# Patient Record
Sex: Female | Born: 1950 | Race: Black or African American | Hispanic: No | Marital: Single | Smoking: Former smoker
Health system: Southern US, Community
[De-identification: ages and names within clinical notes are randomized; demographics above are authoritative.]

## PROBLEM LIST (undated history)

## (undated) DIAGNOSIS — E119 Type 2 diabetes mellitus without complications: Secondary | ICD-10-CM

## (undated) DIAGNOSIS — I10 Essential (primary) hypertension: Secondary | ICD-10-CM

## (undated) DIAGNOSIS — F32A Depression, unspecified: Secondary | ICD-10-CM

## (undated) DIAGNOSIS — E785 Hyperlipidemia, unspecified: Secondary | ICD-10-CM

## (undated) DIAGNOSIS — M1990 Unspecified osteoarthritis, unspecified site: Secondary | ICD-10-CM

## (undated) HISTORY — PX: HAND SURGERY: SHX662

## (undated) HISTORY — DX: Hyperlipidemia, unspecified: E78.5

## (undated) HISTORY — DX: Essential (primary) hypertension: I10

## (undated) HISTORY — DX: Depression, unspecified: F32.A

## (undated) HISTORY — DX: Type 2 diabetes mellitus without complications: E11.9

## (undated) HISTORY — PX: TOTAL KNEE ARTHROPLASTY: SHX125

---
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
## 1997-08-22 ENCOUNTER — Emergency Department (HOSPITAL_COMMUNITY): Admission: EM | Admit: 1997-08-22 | Discharge: 1997-08-22 | Payer: Self-pay | Admitting: Emergency Medicine

## 1997-10-03 ENCOUNTER — Emergency Department (HOSPITAL_COMMUNITY): Admission: EM | Admit: 1997-10-03 | Discharge: 1997-10-03 | Payer: Self-pay

## 1997-12-22 ENCOUNTER — Emergency Department (HOSPITAL_COMMUNITY): Admission: EM | Admit: 1997-12-22 | Discharge: 1997-12-22 | Payer: Self-pay

## 1998-05-01 ENCOUNTER — Emergency Department (HOSPITAL_COMMUNITY): Admission: EM | Admit: 1998-05-01 | Discharge: 1998-05-01 | Payer: Self-pay | Admitting: Emergency Medicine

## 1998-05-01 ENCOUNTER — Encounter: Payer: Self-pay | Admitting: Emergency Medicine

## 1998-08-20 ENCOUNTER — Emergency Department (HOSPITAL_COMMUNITY): Admission: EM | Admit: 1998-08-20 | Discharge: 1998-08-20 | Payer: Self-pay | Admitting: Internal Medicine

## 1998-08-20 ENCOUNTER — Encounter: Payer: Self-pay | Admitting: Internal Medicine

## 1998-12-12 ENCOUNTER — Emergency Department (HOSPITAL_COMMUNITY): Admission: EM | Admit: 1998-12-12 | Discharge: 1998-12-12 | Payer: Self-pay | Admitting: Emergency Medicine

## 1999-07-24 ENCOUNTER — Encounter: Payer: Self-pay | Admitting: Emergency Medicine

## 1999-07-24 ENCOUNTER — Emergency Department (HOSPITAL_COMMUNITY): Admission: EM | Admit: 1999-07-24 | Discharge: 1999-07-24 | Payer: Self-pay | Admitting: Emergency Medicine

## 1999-07-28 ENCOUNTER — Emergency Department (HOSPITAL_COMMUNITY): Admission: EM | Admit: 1999-07-28 | Discharge: 1999-07-28 | Payer: Self-pay | Admitting: Emergency Medicine

## 1999-08-13 ENCOUNTER — Encounter: Admission: RE | Admit: 1999-08-13 | Discharge: 1999-11-11 | Payer: Self-pay | Admitting: Orthopedic Surgery

## 2000-04-25 ENCOUNTER — Emergency Department (HOSPITAL_COMMUNITY): Admission: EM | Admit: 2000-04-25 | Discharge: 2000-04-25 | Payer: Self-pay

## 2000-10-23 ENCOUNTER — Emergency Department (HOSPITAL_COMMUNITY): Admission: EM | Admit: 2000-10-23 | Discharge: 2000-10-23 | Payer: Self-pay | Admitting: Emergency Medicine

## 2000-10-24 ENCOUNTER — Encounter: Payer: Self-pay | Admitting: Emergency Medicine

## 2001-02-15 ENCOUNTER — Emergency Department (HOSPITAL_COMMUNITY): Admission: EM | Admit: 2001-02-15 | Discharge: 2001-02-15 | Payer: Self-pay | Admitting: Emergency Medicine

## 2001-03-08 ENCOUNTER — Emergency Department (HOSPITAL_COMMUNITY): Admission: EM | Admit: 2001-03-08 | Discharge: 2001-03-08 | Payer: Self-pay | Admitting: Emergency Medicine

## 2001-10-20 ENCOUNTER — Emergency Department (HOSPITAL_COMMUNITY): Admission: EM | Admit: 2001-10-20 | Discharge: 2001-10-20 | Payer: Self-pay | Admitting: Emergency Medicine

## 2001-11-25 ENCOUNTER — Encounter: Payer: Self-pay | Admitting: Emergency Medicine

## 2001-11-25 ENCOUNTER — Emergency Department (HOSPITAL_COMMUNITY): Admission: EM | Admit: 2001-11-25 | Discharge: 2001-11-25 | Payer: Self-pay | Admitting: Emergency Medicine

## 2001-12-14 ENCOUNTER — Emergency Department (HOSPITAL_COMMUNITY): Admission: EM | Admit: 2001-12-14 | Discharge: 2001-12-14 | Payer: Self-pay | Admitting: *Deleted

## 2002-02-02 ENCOUNTER — Emergency Department (HOSPITAL_COMMUNITY): Admission: EM | Admit: 2002-02-02 | Discharge: 2002-02-03 | Payer: Self-pay

## 2002-02-12 ENCOUNTER — Emergency Department (HOSPITAL_COMMUNITY): Admission: EM | Admit: 2002-02-12 | Discharge: 2002-02-12 | Payer: Self-pay | Admitting: Emergency Medicine

## 2002-08-10 ENCOUNTER — Emergency Department (HOSPITAL_COMMUNITY): Admission: EM | Admit: 2002-08-10 | Discharge: 2002-08-10 | Payer: Self-pay | Admitting: Emergency Medicine

## 2002-08-10 ENCOUNTER — Encounter: Payer: Self-pay | Admitting: Emergency Medicine

## 2003-01-04 ENCOUNTER — Emergency Department (HOSPITAL_COMMUNITY): Admission: EM | Admit: 2003-01-04 | Discharge: 2003-01-04 | Payer: Self-pay | Admitting: Emergency Medicine

## 2003-03-17 ENCOUNTER — Emergency Department (HOSPITAL_COMMUNITY): Admission: EM | Admit: 2003-03-17 | Discharge: 2003-03-18 | Payer: Self-pay | Admitting: Emergency Medicine

## 2003-05-31 ENCOUNTER — Emergency Department (HOSPITAL_COMMUNITY): Admission: EM | Admit: 2003-05-31 | Discharge: 2003-05-31 | Payer: Self-pay | Admitting: Emergency Medicine

## 2003-10-06 ENCOUNTER — Emergency Department (HOSPITAL_COMMUNITY): Admission: EM | Admit: 2003-10-06 | Discharge: 2003-10-06 | Payer: Self-pay | Admitting: Emergency Medicine

## 2004-03-29 ENCOUNTER — Emergency Department (HOSPITAL_COMMUNITY): Admission: EM | Admit: 2004-03-29 | Discharge: 2004-03-29 | Payer: Self-pay | Admitting: Emergency Medicine

## 2004-08-14 ENCOUNTER — Emergency Department (HOSPITAL_COMMUNITY): Admission: EM | Admit: 2004-08-14 | Discharge: 2004-08-15 | Payer: Self-pay | Admitting: Emergency Medicine

## 2005-05-30 ENCOUNTER — Emergency Department (HOSPITAL_COMMUNITY): Admission: EM | Admit: 2005-05-30 | Discharge: 2005-05-30 | Payer: Self-pay | Admitting: Emergency Medicine

## 2005-07-09 ENCOUNTER — Emergency Department (HOSPITAL_COMMUNITY): Admission: EM | Admit: 2005-07-09 | Discharge: 2005-07-09 | Payer: Self-pay | Admitting: Emergency Medicine

## 2005-11-12 ENCOUNTER — Emergency Department (HOSPITAL_COMMUNITY): Admission: EM | Admit: 2005-11-12 | Discharge: 2005-11-12 | Payer: Self-pay | Admitting: Emergency Medicine

## 2006-06-09 ENCOUNTER — Emergency Department (HOSPITAL_COMMUNITY): Admission: EM | Admit: 2006-06-09 | Discharge: 2006-06-09 | Payer: Self-pay | Admitting: Emergency Medicine

## 2006-07-02 ENCOUNTER — Emergency Department (HOSPITAL_COMMUNITY): Admission: EM | Admit: 2006-07-02 | Discharge: 2006-07-02 | Payer: Self-pay | Admitting: *Deleted

## 2007-11-22 ENCOUNTER — Emergency Department (HOSPITAL_COMMUNITY): Admission: EM | Admit: 2007-11-22 | Discharge: 2007-11-23 | Payer: Self-pay | Admitting: Emergency Medicine

## 2008-03-18 ENCOUNTER — Emergency Department (HOSPITAL_COMMUNITY): Admission: EM | Admit: 2008-03-18 | Discharge: 2008-03-18 | Payer: Self-pay | Admitting: Emergency Medicine

## 2008-03-28 ENCOUNTER — Emergency Department (HOSPITAL_COMMUNITY): Admission: EM | Admit: 2008-03-28 | Discharge: 2008-03-28 | Payer: Self-pay | Admitting: Emergency Medicine

## 2008-05-27 ENCOUNTER — Emergency Department (HOSPITAL_COMMUNITY): Admission: EM | Admit: 2008-05-27 | Discharge: 2008-05-28 | Payer: Self-pay | Admitting: Emergency Medicine

## 2008-08-30 ENCOUNTER — Encounter: Admission: RE | Admit: 2008-08-30 | Discharge: 2008-08-30 | Payer: Self-pay | Admitting: Specialist

## 2008-09-18 ENCOUNTER — Emergency Department (HOSPITAL_COMMUNITY): Admission: EM | Admit: 2008-09-18 | Discharge: 2008-09-18 | Payer: Self-pay | Admitting: Emergency Medicine

## 2009-01-08 ENCOUNTER — Emergency Department (HOSPITAL_COMMUNITY): Admission: EM | Admit: 2009-01-08 | Discharge: 2009-01-08 | Payer: Self-pay | Admitting: Emergency Medicine

## 2010-01-02 ENCOUNTER — Emergency Department (HOSPITAL_COMMUNITY): Admission: EM | Admit: 2010-01-02 | Discharge: 2010-01-02 | Payer: Self-pay | Admitting: Emergency Medicine

## 2010-02-01 ENCOUNTER — Emergency Department (HOSPITAL_COMMUNITY): Admission: EM | Admit: 2010-02-01 | Discharge: 2010-02-01 | Payer: Self-pay | Admitting: Emergency Medicine

## 2010-03-20 ENCOUNTER — Emergency Department (HOSPITAL_COMMUNITY)
Admission: EM | Admit: 2010-03-20 | Discharge: 2010-03-20 | Payer: Self-pay | Source: Home / Self Care | Admitting: Emergency Medicine

## 2010-04-15 HISTORY — PX: TOTAL KNEE ARTHROPLASTY: SHX125

## 2010-07-06 ENCOUNTER — Other Ambulatory Visit (HOSPITAL_COMMUNITY): Payer: Self-pay | Admitting: Orthopedic Surgery

## 2010-07-06 ENCOUNTER — Encounter (HOSPITAL_COMMUNITY)
Admission: RE | Admit: 2010-07-06 | Discharge: 2010-07-06 | Disposition: A | Discharge status: Home / Self Care | Payer: Medicare Other | Source: Ambulatory Visit | Attending: Orthopedic Surgery | Admitting: Orthopedic Surgery

## 2010-07-06 ENCOUNTER — Ambulatory Visit (HOSPITAL_COMMUNITY)
Admission: RE | Admit: 2010-07-06 | Discharge: 2010-07-06 | Disposition: A | Discharge status: Home / Self Care | Payer: Medicare Other | Source: Ambulatory Visit | Attending: Orthopedic Surgery | Admitting: Orthopedic Surgery

## 2010-07-06 DIAGNOSIS — Z01810 Encounter for preprocedural cardiovascular examination: Secondary | ICD-10-CM | POA: Insufficient documentation

## 2010-07-06 DIAGNOSIS — Z01818 Encounter for other preprocedural examination: Secondary | ICD-10-CM | POA: Insufficient documentation

## 2010-07-06 DIAGNOSIS — M1710 Unilateral primary osteoarthritis, unspecified knee: Secondary | ICD-10-CM

## 2010-07-06 DIAGNOSIS — Z01812 Encounter for preprocedural laboratory examination: Secondary | ICD-10-CM | POA: Insufficient documentation

## 2010-07-06 DIAGNOSIS — IMO0002 Reserved for concepts with insufficient information to code with codable children: Secondary | ICD-10-CM | POA: Insufficient documentation

## 2010-07-06 LAB — URINALYSIS, ROUTINE W REFLEX MICROSCOPIC
Hgb urine dipstick: NEGATIVE
Specific Gravity, Urine: 1.008 (ref 1.005–1.030)
Urobilinogen, UA: 0.2 mg/dL (ref 0.0–1.0)

## 2010-07-06 LAB — CBC
MCHC: 33.6 g/dL (ref 30.0–36.0)
Platelets: 250 10*3/uL (ref 150–400)
RDW: 13.9 % (ref 11.5–15.5)

## 2010-07-06 LAB — BASIC METABOLIC PANEL
BUN: 9 mg/dL (ref 6–23)
CO2: 27 mEq/L (ref 19–32)
Calcium: 9.4 mg/dL (ref 8.4–10.5)
Chloride: 107 mEq/L (ref 96–112)
Creatinine, Ser: 0.68 mg/dL (ref 0.4–1.2)
GFR calc Af Amer: >60 mL/min (ref >60)
GFR calc non Af Amer: >60 mL/min (ref >60)
Glucose, Bld: 93 mg/dL (ref 70–99)
Potassium: 4.6 mEq/L (ref 3.5–5.1)
Sodium: 138 mEq/L (ref 135–145)

## 2010-07-06 LAB — URINE MICROSCOPIC-ADD ON

## 2010-07-06 LAB — DIFFERENTIAL
Basophils Absolute: 0 10*3/uL (ref 0.0–0.1)
Basophils Relative: 0 % (ref 0–1)
Eosinophils Absolute: 0.3 10*3/uL (ref 0.0–0.7)
Eosinophils Relative: 3 % (ref 0–5)
Monocytes Absolute: 0.5 10*3/uL (ref 0.1–1.0)
Neutro Abs: 4.6 10*3/uL (ref 1.7–7.7)

## 2010-07-06 LAB — SURGICAL PCR SCREEN
MRSA, PCR: NEGATIVE
Staphylococcus aureus: NEGATIVE

## 2010-07-06 LAB — PROTIME-INR
INR: 0.95 (ref 0.00–1.49)
Prothrombin Time: 12.9 seconds (ref 11.6–15.2)

## 2010-07-06 LAB — TYPE AND SCREEN

## 2010-07-06 LAB — ABO/RH: ABO/RH(D): B POS

## 2010-07-09 ENCOUNTER — Inpatient Hospital Stay (HOSPITAL_COMMUNITY)
Admission: RE | Admit: 2010-07-09 | Discharge: 2010-07-12 | DRG: 470 | Disposition: A | Discharge status: Skilled Nursing Facility | Payer: Medicare Other | Source: Ambulatory Visit | Attending: Orthopedic Surgery | Admitting: Orthopedic Surgery

## 2010-07-09 DIAGNOSIS — F329 Major depressive disorder, single episode, unspecified: Secondary | ICD-10-CM | POA: Diagnosis present

## 2010-07-09 DIAGNOSIS — F3289 Other specified depressive episodes: Secondary | ICD-10-CM | POA: Diagnosis present

## 2010-07-09 DIAGNOSIS — M1710 Unilateral primary osteoarthritis, unspecified knee: Principal | ICD-10-CM | POA: Diagnosis present

## 2010-07-09 DIAGNOSIS — Z87891 Personal history of nicotine dependence: Secondary | ICD-10-CM

## 2010-07-09 DIAGNOSIS — Z01810 Encounter for preprocedural cardiovascular examination: Secondary | ICD-10-CM

## 2010-07-09 DIAGNOSIS — Z01812 Encounter for preprocedural laboratory examination: Secondary | ICD-10-CM

## 2010-07-09 DIAGNOSIS — Z7901 Long term (current) use of anticoagulants: Secondary | ICD-10-CM

## 2010-07-10 LAB — BASIC METABOLIC PANEL
CO2: 27 mEq/L (ref 19–32)
Calcium: 8.6 mg/dL (ref 8.4–10.5)
Creatinine, Ser: 0.74 mg/dL (ref 0.4–1.2)
GFR calc Af Amer: >60 mL/min (ref >60)
GFR calc non Af Amer: >60 mL/min (ref >60)
Sodium: 138 mEq/L (ref 135–145)

## 2010-07-10 LAB — CBC
Platelets: 219 10*3/uL (ref 150–400)
RBC: 4.11 MIL/uL (ref 3.87–5.11)
RDW: 14.3 % (ref 11.5–15.5)
WBC: 9.9 10*3/uL (ref 4.0–10.5)

## 2010-07-10 LAB — PROTIME-INR
INR: 1.08 (ref 0.00–1.49)
Prothrombin Time: 14.2 seconds (ref 11.6–15.2)

## 2010-07-11 LAB — CBC
Hemoglobin: 11.5 g/dL — ABNORMAL LOW (ref 12.0–15.0)
MCH: 29.6 pg (ref 26.0–34.0)
Platelets: 189 10*3/uL (ref 150–400)
RBC: 3.88 MIL/uL (ref 3.87–5.11)

## 2010-07-11 LAB — PROTIME-INR
INR: 1.47 (ref 0.00–1.49)
Prothrombin Time: 18 seconds — ABNORMAL HIGH (ref 11.6–15.2)

## 2010-07-12 LAB — CBC
Hemoglobin: 10.8 g/dL — ABNORMAL LOW (ref 12.0–15.0)
MCH: 29.4 pg (ref 26.0–34.0)
MCHC: 33.1 g/dL (ref 30.0–36.0)
Platelets: 178 10*3/uL (ref 150–400)
RBC: 3.67 MIL/uL — ABNORMAL LOW (ref 3.87–5.11)

## 2010-07-12 LAB — PROTIME-INR
INR: 1.61 — ABNORMAL HIGH (ref 0.00–1.49)
Prothrombin Time: 19.3 seconds — ABNORMAL HIGH (ref 11.6–15.2)

## 2010-07-12 NOTE — Op Note (Signed)
[name], [name]              ACCOUNT NO.:  [id number]  MEDICAL RECORD NO.:  [id number]           PATIENT TYPE:  I  LOCATION:  5029                         FACILITY:  [facility]  PHYSICIAN:  Feliberto Gottron. Turner Daniels, M.D.   DATE OF BIRTH:  Aug 23, 1950  DATE OF PROCEDURE:  07/09/2010 DATE OF DISCHARGE:                              OPERATIVE REPORT   PREOPERATIVE DIAGNOSIS:  End-stage arthritis of the left knee.  POSTOPERATIVE DIAGNOSIS:  End-stage arthritis of the left knee.  PROCEDURE:  Left total knee arthroplasty using DePuy Sigma RP components, 2 femur, 2 tibia, 10-mm Sigma RP bearing and a 32-mm patellar button double batch of DePuy HV cement with 1500 mg of Zinacef.  SURGEON:  Feliberto Gottron. Turner Daniels, MD  FIRST ASSISTANT:  Shirl Harris, PA-C  ANESTHETIC:  Endotracheal.  ESTIMATED BLOOD LOSS:  Minimal.  FLUID REPLACEMENT:  59 mL of crystalloid.  DRAINS PLACED:  Foley catheter.  URINE OUTPUT:  300 mL.  TOURNIQUET TIME:  1 hour and 25 minutes.  INDICATIONS FOR PROCEDURE:  The patient is a 60 year old woman end-stage arthritis of both knees, left worse than right symptomatically.  She is down to bone-on-bone to the medial compartments with varus deformities and desires elective left than right total knee arthroplasty.  Risks and benefits of surgery have been discussed, questions answered.  DESCRIPTION OF PROCEDURE:  The patient identified by armband, received preoperative IV antibiotics in the holding area at [facility] followed by left femoral nerve block.  She was then taken to operating room 5, appropriate site monitors were attached and general endotracheal anesthesia induced with the patient in supine position.  Foley catheter inserted.  Tourniquet applied high to the left thigh and left lower extremity prepped and draped in usual sterile fashion from the ankle to the tourniquet.  A foot positioner lateral post was also applied to the table.  Time-out procedure was  performed.  Limb wrapped with an Esmarch bandage, tourniquet inflated to 350 mmHg.  We began the operation itself by making an anterior midline incision centered over the patella through the skin and subcutaneous tissue.  We started handbreadth above the patella and entered 1 cm medial to and 3 cm distal to the tibial tubercle.  We identified transverse retinaculum which was incised in line with the skin incision, reflected medially allowing a medial parapatellar arthrotomy.  The patella was everted, prepatellar fat pad resected.  Superficial medial collateral ligament elevated from anterior to posterior off the proximal tibia leaving intact distally allowing external rotation of the tibia.  With the patella everted, we then resected the anterior one-half of the menisci and the cruciate ligaments, placed a posteromedial Z retractor and Michaela retractor through the notch and a lateral Homan retractor completing our proximal tibia exposure with the knee hyperflexed.  We then entered the tibial plateau in line with the axis of the tibia with the DePuy step drill followed by intramedullary guide rod and a 2-degree posterior slope cutting guide that was set to allow removal of about 3-4 mm of bone medially, 8-9 mm of bone and cartilage laterally.  Proximal tibial cut was then accomplished  without difficulty.  We entered the distal femur 2 mm anterior to the PCL origin with a step drill followed by the IM rod and a 5-degree left distal femoral cutting guide set at 10 mm.  This was pinned along the epicondylar axis.  The distal femoral cut accomplished. We sized for a #2 femoral implant using the posterior referencing sizing guide.  The chamfer cutting guide was then pinned in 3 degrees of external rotation, anterior and posterior and chamfer cuts accomplished without difficulty followed by the Sigma RP box cutting guide and cut. The knee was then brought to full extension with traction,  allowing resection of the posterior half of the menisci.  We checked our extension gap which was good for a 10-mm bearing and the flexion gap was also in line for a 10-mm bearing.  The patella was measured at 20 mm. We thought it would fit 32 button.  The cutting guide was set at 13 and the posterior 7-8 mm of the patella resected, sized for a 32 button and drilled.  The knee was then hyperflexed exposing the proximal tibia which was sized to #2 tibial trial plate, followed by the smokestack conical reamer and Delta fin keel punch.  We then hammered into place a 2 left trial femoral component, drilled the lugs, inserted a 10-mm bearing, reduced the knee and took it through range of motion from 0 to 130 degrees with the trial patellar button and tracked with no thumb pressure.  At this point the trial components removed, all bony surfaces were then water picked, clean dried with suction and sponges at the back table, a double batch of DePuy HV cement with 1500 mg Zinacef was mixed and applied to all bony and metallic mating surfaces except for the posterior condyles of the femur itself.  In order we hammered into place a two tibial baseplate and removed excess cement.  A two left femoral component removed excess cement, inserted a 10-mm sigma RP bearing, reduced the knee and brought it to full extension with compression again, squeezing out a little bit of cement that was removed with the [employer].  The 32-mm patellar button was squeezed onto the patella with the clamp and excess cement removed.  The wound irrigated out with normal saline solution and pulse lavage.  Medium Hemovac drains were placed from anterolateral approach as the cement cured.  We then took the knee through range of motion with the clamp off after the cement cured and again no thumb pressure was required and the ligaments were noted to be stable.  At this point we closed parapatellar arthrotomy with running #1  Vicryl suture, the subcutaneous tissue with 0 and 2-0 undyed Vicryl suture and the skin with skin staples.  Dressing of Xeroform, 4x4 dressing, sponges, Webril and Ace wrap applied. Tourniquet let down.  The toes were noted to pink up.  The patient awakened, extubated and taken to the recovery room without difficulty.     Feliberto Gottron. Turner Daniels, M.D.     Ovid Curd  D:  07/10/2010  T:  07/11/2010  Job:  [id number]  Electronically Signed by Gean Birchwood M.D. on 07/12/2010 11:17:45 PM

## 2010-07-16 ENCOUNTER — Emergency Department (HOSPITAL_COMMUNITY)
Admission: EM | Admit: 2010-07-16 | Discharge: 2010-07-16 | Disposition: A | Discharge status: Home / Self Care | Payer: Medicare Other | Attending: Emergency Medicine | Admitting: Emergency Medicine

## 2010-07-16 DIAGNOSIS — Z9889 Other specified postprocedural states: Secondary | ICD-10-CM | POA: Insufficient documentation

## 2010-07-16 DIAGNOSIS — T391X5A Adverse effect of 4-Aminophenol derivatives, initial encounter: Secondary | ICD-10-CM | POA: Insufficient documentation

## 2010-07-16 DIAGNOSIS — F329 Major depressive disorder, single episode, unspecified: Secondary | ICD-10-CM | POA: Insufficient documentation

## 2010-07-16 DIAGNOSIS — L270 Generalized skin eruption due to drugs and medicaments taken internally: Secondary | ICD-10-CM | POA: Insufficient documentation

## 2010-07-16 DIAGNOSIS — F3289 Other specified depressive episodes: Secondary | ICD-10-CM | POA: Insufficient documentation

## 2010-07-16 DIAGNOSIS — Z79899 Other long term (current) drug therapy: Secondary | ICD-10-CM | POA: Insufficient documentation

## 2010-07-16 DIAGNOSIS — L299 Pruritus, unspecified: Secondary | ICD-10-CM | POA: Insufficient documentation

## 2010-07-16 DIAGNOSIS — Y9210 Unspecified residential institution as the place of occurrence of the external cause: Secondary | ICD-10-CM | POA: Insufficient documentation

## 2010-07-16 DIAGNOSIS — Z7901 Long term (current) use of anticoagulants: Secondary | ICD-10-CM | POA: Insufficient documentation

## 2010-07-16 DIAGNOSIS — M129 Arthropathy, unspecified: Secondary | ICD-10-CM | POA: Insufficient documentation

## 2010-07-17 ENCOUNTER — Emergency Department (HOSPITAL_COMMUNITY)
Admission: EM | Admit: 2010-07-17 | Discharge: 2010-07-18 | Disposition: A | Discharge status: Home / Self Care | Payer: Medicare Other | Attending: Emergency Medicine | Admitting: Emergency Medicine

## 2010-07-17 DIAGNOSIS — L298 Other pruritus: Secondary | ICD-10-CM | POA: Insufficient documentation

## 2010-07-17 DIAGNOSIS — L500 Allergic urticaria: Secondary | ICD-10-CM | POA: Insufficient documentation

## 2010-07-17 DIAGNOSIS — T40605A Adverse effect of unspecified narcotics, initial encounter: Secondary | ICD-10-CM | POA: Insufficient documentation

## 2010-07-17 DIAGNOSIS — L2989 Other pruritus: Secondary | ICD-10-CM | POA: Insufficient documentation

## 2010-07-20 NOTE — Discharge Summary (Signed)
Erin, Webb              ACCOUNT NO.:  [id number]  MEDICAL RECORD NO.:  [id number]           PATIENT TYPE:  I  LOCATION:  5029                         FACILITY:  [facility]  PHYSICIAN:  Feliberto Gottron. Turner Daniels, M.D.   DATE OF BIRTH:  [birth year]  DATE OF ADMISSION:  07/09/2010 DATE OF DISCHARGE:  07/12/2010                              DISCHARGE SUMMARY   CHIEF COMPLAINT:  Left hip pain.  HISTORY OF PRESENT ILLNESS:  This is a 60 year old lady who complains of severe unremitting pain in her left knee despite extensive conservative treatment.  She now desires a surgical intervention.  All risks and benefits of surgery were discussed with the patient.  PAST MEDICAL HISTORY:  Significant for depression.  PAST SURGICAL HISTORY:  She has not listed any surgeries.  FAMILY HISTORY:  Noncontributory.  SOCIAL HISTORY:  She quit smoking several years ago and denies use of alcohol.  ALLERGIES:  No known drug allergies.  PHYSICAL EXAMINATION:  Gross examination the left knee demonstrates a varus deformity.  Range of motion is 5 to 100 degrees.  She is tender to palpation along the medial joint line.  X-rays demonstrate end-stage arthritis in the medial compartment of the left knee.  PREOPERATIVE LABORATORY DATA:  White blood cells 9.0, red blood cells 4.88, hemoglobin 43.1, hematocrit 88.3, platelets 250.  PT 12.9, INR 0.95, PTT 28.  Sodium 138, potassium 4.6, chloride 107, glucose 93, BUN 9, creatinine 0.68.  Urinalysis was within normal limits.  HOSPITAL COURSE:  [name] was admitted to [facility] on July 09, 2010 when she underwent left total knee arthroplasty.  The procedure was performed by Dr. Gean Birchwood, and the patient tolerated it well.  Two Hemovac drains were placed in the left knee perioperative.  Foley catheter was also placed.  She was transferred to the floor in stable condition on Lovenox and Coumadin for DVT prophylaxis.  On the first postoperative day, she was  awake and alert and reporting good pain control.  Her drain was removed without difficulty.  The Foley catheter was taken out after physical therapy.  Hemoglobin was 12.  On the second postoperative day, she complained of itching, but was otherwise doing well.  She was making slow progress with physical therapy.  On postoperative day #3, she was eating well and continued to report good pain control.  Her surgical dressing was changed and her incision was found to be benign.  Hemoglobin was 11.5.  She was discharged to skilled nursing for a short stay for rehab.  DISPOSITION:  The patient was discharged on July 12, 2010.  She was weightbearing as tolerated.  Skilled nursing would manage her wound, Coumadin, and physical therapy.  Her target INR is 1.5 to 2.0.  She will continue all meds as per the HMR with the addition of Percocet for pain control and Coumadin.  She will return to the clinic in approximately 10 days for x-rays and staple removal.  FINAL DIAGNOSIS:  End-stage degenerative joint disease of the left knee.  Shirl Harris, PA   ______________________________ Feliberto Gottron. Turner Daniels, M.D.    JW/MEDQ  D:  07/12/2010  T:  07/12/2010  Job:  [id number]  Electronically Signed by Shirl Harris PA on 07/16/2010 09:50:30 AM Electronically Signed by Gean Birchwood M.D. on 07/20/2010 05:37:47 AM

## 2010-07-21 ENCOUNTER — Emergency Department (HOSPITAL_COMMUNITY)
Admission: EM | Admit: 2010-07-21 | Discharge: 2010-07-21 | Disposition: A | Discharge status: Home / Self Care | Payer: Medicare Other | Attending: Emergency Medicine | Admitting: Emergency Medicine

## 2010-07-21 DIAGNOSIS — Z96659 Presence of unspecified artificial knee joint: Secondary | ICD-10-CM | POA: Insufficient documentation

## 2010-07-21 DIAGNOSIS — M25469 Effusion, unspecified knee: Secondary | ICD-10-CM | POA: Insufficient documentation

## 2010-07-21 DIAGNOSIS — M25569 Pain in unspecified knee: Secondary | ICD-10-CM | POA: Insufficient documentation

## 2010-07-21 DIAGNOSIS — R61 Generalized hyperhidrosis: Secondary | ICD-10-CM | POA: Insufficient documentation

## 2010-07-21 DIAGNOSIS — Z79899 Other long term (current) drug therapy: Secondary | ICD-10-CM | POA: Insufficient documentation

## 2010-07-21 DIAGNOSIS — F329 Major depressive disorder, single episode, unspecified: Secondary | ICD-10-CM | POA: Insufficient documentation

## 2010-07-21 DIAGNOSIS — M129 Arthropathy, unspecified: Secondary | ICD-10-CM | POA: Insufficient documentation

## 2010-07-21 DIAGNOSIS — Z7901 Long term (current) use of anticoagulants: Secondary | ICD-10-CM | POA: Insufficient documentation

## 2010-07-21 DIAGNOSIS — F3289 Other specified depressive episodes: Secondary | ICD-10-CM | POA: Insufficient documentation

## 2010-07-23 LAB — URINALYSIS, ROUTINE W REFLEX MICROSCOPIC
Bilirubin Urine: NEGATIVE
Hgb urine dipstick: NEGATIVE
Nitrite: NEGATIVE
Protein, ur: NEGATIVE mg/dL
Urobilinogen, UA: 0.2 mg/dL (ref 0.0–1.0)

## 2010-07-26 ENCOUNTER — Emergency Department (HOSPITAL_COMMUNITY)
Admission: EM | Admit: 2010-07-26 | Discharge: 2010-07-26 | Disposition: A | Discharge status: Home / Self Care | Payer: Medicare Other | Attending: Emergency Medicine | Admitting: Emergency Medicine

## 2010-07-26 DIAGNOSIS — F3289 Other specified depressive episodes: Secondary | ICD-10-CM | POA: Insufficient documentation

## 2010-07-26 DIAGNOSIS — M25569 Pain in unspecified knee: Secondary | ICD-10-CM | POA: Insufficient documentation

## 2010-07-26 DIAGNOSIS — Z96659 Presence of unspecified artificial knee joint: Secondary | ICD-10-CM | POA: Insufficient documentation

## 2010-07-26 DIAGNOSIS — M129 Arthropathy, unspecified: Secondary | ICD-10-CM | POA: Insufficient documentation

## 2010-07-26 DIAGNOSIS — F329 Major depressive disorder, single episode, unspecified: Secondary | ICD-10-CM | POA: Insufficient documentation

## 2010-07-31 LAB — URINALYSIS, ROUTINE W REFLEX MICROSCOPIC
Glucose, UA: NEGATIVE mg/dL
Hgb urine dipstick: NEGATIVE
Ketones, ur: NEGATIVE mg/dL
Protein, ur: NEGATIVE mg/dL
Urobilinogen, UA: 1 mg/dL (ref 0.0–1.0)

## 2010-07-31 LAB — CBC
HCT: 41.2 % (ref 36.0–46.0)
Hemoglobin: 14.2 g/dL (ref 12.0–15.0)
MCV: 90.8 fL (ref 78.0–100.0)
RBC: 4.53 MIL/uL (ref 3.87–5.11)
WBC: 10.7 10*3/uL — ABNORMAL HIGH (ref 4.0–10.5)

## 2010-07-31 LAB — LIPASE, BLOOD: Lipase: 37 U/L (ref 11–59)

## 2010-07-31 LAB — DIFFERENTIAL
Basophils Absolute: 0 10*3/uL (ref 0.0–0.1)
Eosinophils Relative: 3 % (ref 0–5)
Lymphocytes Relative: 26 % (ref 12–46)
Neutrophils Relative %: 67 % (ref 43–77)

## 2010-07-31 LAB — COMPREHENSIVE METABOLIC PANEL
AST: 14 U/L (ref 0–37)
BUN: 9 mg/dL (ref 6–23)
CO2: 24 mEq/L (ref 19–32)
Chloride: 107 mEq/L (ref 96–112)
Creatinine, Ser: 0.64 mg/dL (ref 0.4–1.2)
GFR calc non Af Amer: >60 mL/min (ref >60)
Glucose, Bld: 109 mg/dL — ABNORMAL HIGH (ref 70–99)
Total Bilirubin: 0.6 mg/dL (ref 0.3–1.2)

## 2010-09-03 ENCOUNTER — Ambulatory Visit: Payer: Medicare Other | Attending: Orthopedic Surgery

## 2010-09-03 DIAGNOSIS — R262 Difficulty in walking, not elsewhere classified: Secondary | ICD-10-CM | POA: Insufficient documentation

## 2010-09-03 DIAGNOSIS — Z96659 Presence of unspecified artificial knee joint: Secondary | ICD-10-CM | POA: Insufficient documentation

## 2010-09-03 DIAGNOSIS — IMO0001 Reserved for inherently not codable concepts without codable children: Secondary | ICD-10-CM | POA: Insufficient documentation

## 2010-09-03 DIAGNOSIS — M25569 Pain in unspecified knee: Secondary | ICD-10-CM | POA: Insufficient documentation

## 2010-09-03 DIAGNOSIS — M25669 Stiffness of unspecified knee, not elsewhere classified: Secondary | ICD-10-CM | POA: Insufficient documentation

## 2010-09-12 ENCOUNTER — Ambulatory Visit: Payer: Medicare Other

## 2010-09-15 ENCOUNTER — Emergency Department (HOSPITAL_COMMUNITY)
Admission: EM | Admit: 2010-09-15 | Discharge: 2010-09-16 | Disposition: A | Discharge status: Home / Self Care | Payer: Medicare Other | Attending: Emergency Medicine | Admitting: Emergency Medicine

## 2010-09-15 ENCOUNTER — Emergency Department (HOSPITAL_COMMUNITY): Payer: Medicare Other

## 2010-09-15 DIAGNOSIS — M129 Arthropathy, unspecified: Secondary | ICD-10-CM | POA: Insufficient documentation

## 2010-09-15 DIAGNOSIS — W010XXA Fall on same level from slipping, tripping and stumbling without subsequent striking against object, initial encounter: Secondary | ICD-10-CM | POA: Insufficient documentation

## 2010-09-15 DIAGNOSIS — M25469 Effusion, unspecified knee: Secondary | ICD-10-CM | POA: Insufficient documentation

## 2010-09-15 DIAGNOSIS — F3289 Other specified depressive episodes: Secondary | ICD-10-CM | POA: Insufficient documentation

## 2010-09-15 DIAGNOSIS — F329 Major depressive disorder, single episode, unspecified: Secondary | ICD-10-CM | POA: Insufficient documentation

## 2010-09-15 DIAGNOSIS — Y92009 Unspecified place in unspecified non-institutional (private) residence as the place of occurrence of the external cause: Secondary | ICD-10-CM | POA: Insufficient documentation

## 2010-09-15 DIAGNOSIS — M25569 Pain in unspecified knee: Secondary | ICD-10-CM | POA: Insufficient documentation

## 2010-09-15 DIAGNOSIS — Z96659 Presence of unspecified artificial knee joint: Secondary | ICD-10-CM | POA: Insufficient documentation

## 2010-09-16 ENCOUNTER — Emergency Department (HOSPITAL_COMMUNITY): Payer: Medicare Other

## 2010-09-17 ENCOUNTER — Ambulatory Visit: Payer: Medicare Other | Attending: Orthopedic Surgery | Admitting: Physical Therapy

## 2010-09-17 DIAGNOSIS — IMO0001 Reserved for inherently not codable concepts without codable children: Secondary | ICD-10-CM | POA: Insufficient documentation

## 2010-09-17 DIAGNOSIS — R262 Difficulty in walking, not elsewhere classified: Secondary | ICD-10-CM | POA: Insufficient documentation

## 2010-09-17 DIAGNOSIS — Z96659 Presence of unspecified artificial knee joint: Secondary | ICD-10-CM | POA: Insufficient documentation

## 2010-09-17 DIAGNOSIS — M25669 Stiffness of unspecified knee, not elsewhere classified: Secondary | ICD-10-CM | POA: Insufficient documentation

## 2010-09-17 DIAGNOSIS — M25569 Pain in unspecified knee: Secondary | ICD-10-CM | POA: Insufficient documentation

## 2010-09-24 ENCOUNTER — Ambulatory Visit: Payer: Medicare Other

## 2010-09-26 ENCOUNTER — Ambulatory Visit: Payer: Medicare Other

## 2010-10-02 ENCOUNTER — Ambulatory Visit: Payer: Medicare Other | Admitting: Physical Therapy

## 2010-10-03 ENCOUNTER — Ambulatory Visit: Payer: Medicare Other | Admitting: Physical Therapy

## 2010-10-04 ENCOUNTER — Ambulatory Visit: Payer: Medicare Other | Admitting: Physical Therapy

## 2010-10-09 ENCOUNTER — Emergency Department (HOSPITAL_COMMUNITY): Payer: Medicare Other

## 2010-10-09 ENCOUNTER — Emergency Department (HOSPITAL_COMMUNITY)
Admission: EM | Admit: 2010-10-09 | Discharge: 2010-10-09 | Disposition: A | Discharge status: Home / Self Care | Payer: Medicare Other | Attending: Emergency Medicine | Admitting: Emergency Medicine

## 2010-10-09 DIAGNOSIS — M79609 Pain in unspecified limb: Secondary | ICD-10-CM | POA: Insufficient documentation

## 2010-10-09 DIAGNOSIS — Z9889 Other specified postprocedural states: Secondary | ICD-10-CM | POA: Insufficient documentation

## 2010-10-09 DIAGNOSIS — F329 Major depressive disorder, single episode, unspecified: Secondary | ICD-10-CM | POA: Insufficient documentation

## 2010-10-09 DIAGNOSIS — IMO0002 Reserved for concepts with insufficient information to code with codable children: Secondary | ICD-10-CM | POA: Insufficient documentation

## 2010-10-09 DIAGNOSIS — M129 Arthropathy, unspecified: Secondary | ICD-10-CM | POA: Insufficient documentation

## 2010-10-09 DIAGNOSIS — W230XXA Caught, crushed, jammed, or pinched between moving objects, initial encounter: Secondary | ICD-10-CM | POA: Insufficient documentation

## 2010-10-09 DIAGNOSIS — F3289 Other specified depressive episodes: Secondary | ICD-10-CM | POA: Insufficient documentation

## 2010-10-10 ENCOUNTER — Ambulatory Visit: Payer: Medicare Other

## 2010-10-11 ENCOUNTER — Ambulatory Visit: Payer: Medicare Other

## 2010-10-15 ENCOUNTER — Ambulatory Visit: Payer: Medicare Other | Attending: Orthopedic Surgery

## 2010-10-15 DIAGNOSIS — IMO0001 Reserved for inherently not codable concepts without codable children: Secondary | ICD-10-CM | POA: Insufficient documentation

## 2010-10-15 DIAGNOSIS — R262 Difficulty in walking, not elsewhere classified: Secondary | ICD-10-CM | POA: Insufficient documentation

## 2010-10-15 DIAGNOSIS — M25569 Pain in unspecified knee: Secondary | ICD-10-CM | POA: Insufficient documentation

## 2010-10-15 DIAGNOSIS — Z96659 Presence of unspecified artificial knee joint: Secondary | ICD-10-CM | POA: Insufficient documentation

## 2010-10-15 DIAGNOSIS — M25669 Stiffness of unspecified knee, not elsewhere classified: Secondary | ICD-10-CM | POA: Insufficient documentation

## 2010-10-16 ENCOUNTER — Ambulatory Visit: Payer: Medicare Other

## 2010-10-18 ENCOUNTER — Ambulatory Visit: Payer: Medicare Other

## 2010-10-23 ENCOUNTER — Ambulatory Visit: Payer: Medicare Other

## 2010-10-24 ENCOUNTER — Ambulatory Visit: Payer: Medicare Other | Admitting: Physical Therapy

## 2010-10-31 ENCOUNTER — Ambulatory Visit: Payer: Medicare Other

## 2010-11-01 ENCOUNTER — Ambulatory Visit: Payer: Medicare Other

## 2010-11-02 ENCOUNTER — Ambulatory Visit: Payer: Medicare Other

## 2010-11-06 ENCOUNTER — Encounter: Payer: Medicare Other | Admitting: Physical Therapy

## 2010-11-06 ENCOUNTER — Emergency Department (HOSPITAL_COMMUNITY)
Admission: EM | Admit: 2010-11-06 | Discharge: 2010-11-07 | Disposition: A | Discharge status: Home / Self Care | Payer: Medicare Other | Attending: Emergency Medicine | Admitting: Emergency Medicine

## 2010-11-06 DIAGNOSIS — Z79899 Other long term (current) drug therapy: Secondary | ICD-10-CM | POA: Insufficient documentation

## 2010-11-06 DIAGNOSIS — M25569 Pain in unspecified knee: Secondary | ICD-10-CM | POA: Insufficient documentation

## 2010-11-06 DIAGNOSIS — F329 Major depressive disorder, single episode, unspecified: Secondary | ICD-10-CM | POA: Insufficient documentation

## 2010-11-06 DIAGNOSIS — F3289 Other specified depressive episodes: Secondary | ICD-10-CM | POA: Insufficient documentation

## 2010-11-06 DIAGNOSIS — M129 Arthropathy, unspecified: Secondary | ICD-10-CM | POA: Insufficient documentation

## 2010-12-01 ENCOUNTER — Emergency Department (HOSPITAL_COMMUNITY): Payer: Medicare Other

## 2010-12-01 ENCOUNTER — Emergency Department (HOSPITAL_COMMUNITY)
Admission: EM | Admit: 2010-12-01 | Discharge: 2010-12-01 | Disposition: A | Discharge status: Home / Self Care | Payer: Medicare Other | Attending: Emergency Medicine | Admitting: Emergency Medicine

## 2010-12-01 DIAGNOSIS — W010XXA Fall on same level from slipping, tripping and stumbling without subsequent striking against object, initial encounter: Secondary | ICD-10-CM | POA: Insufficient documentation

## 2010-12-01 DIAGNOSIS — M25569 Pain in unspecified knee: Secondary | ICD-10-CM | POA: Insufficient documentation

## 2010-12-28 ENCOUNTER — Encounter (HOSPITAL_COMMUNITY)
Admission: RE | Admit: 2010-12-28 | Discharge: 2010-12-28 | Disposition: A | Discharge status: Home / Self Care | Payer: Medicare Other | Source: Ambulatory Visit | Attending: Orthopedic Surgery | Admitting: Orthopedic Surgery

## 2010-12-28 DIAGNOSIS — Z01818 Encounter for other preprocedural examination: Secondary | ICD-10-CM | POA: Insufficient documentation

## 2010-12-28 LAB — BASIC METABOLIC PANEL
BUN: 10 mg/dL (ref 6–23)
GFR calc Af Amer: >60 mL/min (ref >60)
GFR calc non Af Amer: >60 mL/min (ref >60)
Potassium: 4.4 mEq/L (ref 3.5–5.1)
Sodium: 139 mEq/L (ref 135–145)

## 2010-12-28 LAB — URINALYSIS, ROUTINE W REFLEX MICROSCOPIC
Bilirubin Urine: NEGATIVE
Glucose, UA: NEGATIVE mg/dL
Hgb urine dipstick: NEGATIVE
Specific Gravity, Urine: 1.024 (ref 1.005–1.030)
pH: 5 (ref 5.0–8.0)

## 2010-12-28 LAB — URINE MICROSCOPIC-ADD ON

## 2010-12-28 LAB — DIFFERENTIAL
Basophils Absolute: 0 10*3/uL (ref 0.0–0.1)
Eosinophils Absolute: 0.3 10*3/uL (ref 0.0–0.7)
Eosinophils Relative: 3 % (ref 0–5)
Lymphocytes Relative: 33 % (ref 12–46)
Neutrophils Relative %: 58 % (ref 43–77)

## 2010-12-28 LAB — CBC
HCT: 44.3 % (ref 36.0–46.0)
Platelets: 233 10*3/uL (ref 150–400)
RDW: 14.8 % (ref 11.5–15.5)
WBC: 10.5 10*3/uL (ref 4.0–10.5)

## 2010-12-28 LAB — PROTIME-INR: INR: 0.94 (ref 0.00–1.49)

## 2011-01-11 LAB — CBC
MCV: 91.6
Platelets: 243
WBC: 11.1 — ABNORMAL HIGH

## 2011-01-11 LAB — POCT I-STAT, CHEM 8
BUN: 13
HCT: 45
Hemoglobin: 15.3 — ABNORMAL HIGH
Sodium: 142
TCO2: 23

## 2011-01-11 LAB — POCT CARDIAC MARKERS
CKMB, poc: <1 — ABNORMAL LOW
CKMB, poc: <1 — ABNORMAL LOW
Myoglobin, poc: 33.7

## 2011-01-25 ENCOUNTER — Encounter (HOSPITAL_COMMUNITY)
Admission: RE | Admit: 2011-01-25 | Discharge: 2011-01-25 | Disposition: A | Discharge status: Home / Self Care | Payer: Medicare Other | Source: Ambulatory Visit | Attending: Orthopedic Surgery | Admitting: Orthopedic Surgery

## 2011-01-25 LAB — CBC
HCT: 43.4 % (ref 36.0–46.0)
Hemoglobin: 14.7 g/dL (ref 12.0–15.0)
MCH: 29.6 pg (ref 26.0–34.0)
MCHC: 33.9 g/dL (ref 30.0–36.0)
MCV: 87.5 fL (ref 78.0–100.0)

## 2011-02-06 ENCOUNTER — Inpatient Hospital Stay (HOSPITAL_COMMUNITY)
Admission: RE | Admit: 2011-02-06 | Discharge: 2011-02-10 | DRG: 470 | Disposition: A | Discharge status: Skilled Nursing Facility | Payer: Medicare Other | Source: Ambulatory Visit | Attending: Orthopedic Surgery | Admitting: Orthopedic Surgery

## 2011-02-06 DIAGNOSIS — M1710 Unilateral primary osteoarthritis, unspecified knee: Principal | ICD-10-CM | POA: Diagnosis present

## 2011-02-06 DIAGNOSIS — F17200 Nicotine dependence, unspecified, uncomplicated: Secondary | ICD-10-CM | POA: Diagnosis present

## 2011-02-06 DIAGNOSIS — Z01812 Encounter for preprocedural laboratory examination: Secondary | ICD-10-CM

## 2011-02-06 LAB — PROTIME-INR
INR: 1.01 (ref 0.00–1.49)
Prothrombin Time: 13.5 seconds (ref 11.6–15.2)

## 2011-02-06 LAB — TYPE AND SCREEN

## 2011-02-07 LAB — BASIC METABOLIC PANEL
Calcium: 8.6 mg/dL (ref 8.4–10.5)
GFR calc Af Amer: >90 mL/min (ref >90)
GFR calc non Af Amer: >90 mL/min (ref >90)
Potassium: 3.7 mEq/L (ref 3.5–5.1)
Sodium: 136 mEq/L (ref 135–145)

## 2011-02-07 LAB — CBC
Platelets: 191 10*3/uL (ref 150–400)
RBC: 3.81 MIL/uL — ABNORMAL LOW (ref 3.87–5.11)
RDW: 14.2 % (ref 11.5–15.5)
WBC: 8.7 10*3/uL (ref 4.0–10.5)

## 2011-02-07 LAB — PROTIME-INR: Prothrombin Time: 15 seconds (ref 11.6–15.2)

## 2011-02-08 LAB — PROTIME-INR
INR: 1.35 (ref 0.00–1.49)
Prothrombin Time: 16.9 seconds — ABNORMAL HIGH (ref 11.6–15.2)

## 2011-02-08 LAB — CBC
Hemoglobin: 10.5 g/dL — ABNORMAL LOW (ref 12.0–15.0)
MCHC: 32.6 g/dL (ref 30.0–36.0)
Platelets: 170 10*3/uL (ref 150–400)
RBC: 3.6 MIL/uL — ABNORMAL LOW (ref 3.87–5.11)

## 2011-02-08 NOTE — Op Note (Signed)
NAMEQUINLEY, NESLER              ACCOUNT NO.:  [id number]  MEDICAL RECORD NO.:  [id number]  LOCATION:  5022                         FACILITY:  [facility]  PHYSICIAN:  Feliberto Gottron. Turner Daniels, M.D.   DATE OF BIRTH:  09-26-1950  DATE OF PROCEDURE:  02/06/2011 DATE OF DISCHARGE:                              OPERATIVE REPORT   PREOPERATIVE DIAGNOSIS:  End-stage arthritis of right knee.  POSTOPERATIVE DIAGNOSIS:  End-stage arthritis of right knee.  PROCEDURE:  Right total knee arthroplasty using DePuy Sigma RP components, 2 femur, 2 tibia, 10-mm Sigma RP bearing, 32-mm patellar button.  All components cemented double batch of DePuy HV cement with 1500 mg of Zinacef.  SURGEON:  Feliberto Gottron. Turner Daniels, MD  FIRST ASSISTANT:  Shirl Harris, PA-C  ANESTHETIC:  General endotracheal.  ESTIMATED BLOOD LOSS:  Minimal.  FLUID REPLACEMENT:  1500 mL of crystalloid.  DRAINS PLACED:  2 medium Hemovac, Foley catheter.  URINE OUTPUT:  300 mL.  INDICATIONS FOR PROCEDURE:  The patient underwent successful left total knee arthroplasty a few months ago, has done well and now desires same on the right side.  She has end-stage arthritis with varus deformity bone-on-bone.  Pain wakes her up at night, may help get in and out of chair and she has done very well with her left total knee and very much desires right total.  Risks and benefits of surgery are known by the patient and were reinforced preoperatively.  DESCRIPTION OF PROCEDURE:  The patient was identified by armband and received preoperative IV antibiotics in the holding area at [facility] as well a right femoral nerve block.  Taken to operating room 1, where appropriate anesthetic monitors were attached and general endotracheal anesthesia was induced with the patient in the supine position.  Tourniquet was applied high to the right thigh, lateral post and foot positioner applied to the table.  Right lower extremity prepped and draped in usual  sterile fashion from the ankle to the tourniquet and a time-out procedure was performed.  Limb wrapped with an Esmarch bandage, tourniquet inflated to 350 mmHg.  We began the operation itself by making a standard anterior midline incision, starting at handbreadth above the patella going over the patella and then 1-cm medial, 2 and 4- cm distal to the tibial tubercle.  Small bleeders in the skin and subcutaneous tissue were identified and cauterized.  Transverse retinaculum incised and reflected medially allowing a medial parapatellar arthrotomy.  The patella was everted.  Prepatellar fat pad resected.  We then elevated the superficial medial collateral ligament from anterior to posterior off the proximal medial flare of the tibia leaving intact distally.  This allowed [place] to evert the patella, hyperflex the knee with external rotation, remove the anterior one half of the menisci as well as the cruciate ligaments.  We entered the proximal tibia in line with the shaft using the DePuy step drill, followed by the IM rod and a 2 degree posterior slope cutting guide. Because of the severe varus deformity, I elected a 2-mm bone medially and 8-mm of bone laterally referencing off the axis.  Posterior structures were protected with a posteromedial Z retractor and McCulloch retractor through the notch.  Lateral Hohmann retractor as well.  We performed a tibial resection and we then entered the distal femur 2-mm anterior to the PCL origin with a DePuy step drill, followed by the intramedullary rod and a 5-degree right distal femoral cutting guide, set at 11-mm and pinned along the epicondylar axis.  After performing the distal femoral cut, we then measured for a #2 femoral component symmetric with the contralateral side and pinned the guide in 3 degrees of external rotation.  We then performed anterior, posterior and chamfer cuts as well as the Sigma RP box cut.  The knee was brought into  full extension allowing [place] to remove the posterior horns of the meniscus and also to check our extension and flexion gaps which were good for a 10-mm bearing.  The patella itself was then measured at 19-mm.  The cutting guide set at 11 for an 8-mm posterior patellar cut which was then accomplished.  We sized for a 32 button again symmetric with the contralateral side, and drilled the lollipop.  The knee was once again hyperflexed.  Proximal tibia measured for a #2 tibial base plate which was pinned into place, followed by a smokestack.  The smokestack conical reamer and the Delta fin keel punch.  The knee flexed, we then hammered into place.  A 2 right trial femoral component drilled the lugs, inserted a 10-mm bearing as well as a 32-mm trial patella, took the knee through range of motion from 0-130 degrees.  Stability was excellent. No thumb pressure was required on the patella.  At this point, all trial components were removed.  All bony surfaces were water picked, clean and dry with suction and sponges.  A double batch of DePuy HV cement with 1500 mg of Zinacef was then mixed and applied to all bony metallic mating surfaces except for the posterior condyles of the femur itself. In order, we then hammered into place the #2 tibial component.  The 2 right femoral component inserted a 10-mm Sigma RP bearing and brought the knee to full extension after removing the excess cement.  The patellar component was then squeezed into place and excess cement was removed, and held with a clamp.  As the cement cured, we water picked the wound, cleaned and inserted 2 medium Hemovacs from a lateral parapatellar approach to the skin.  After the cement had cured, we checked our stability 1 more time and then closed in layers with running #1 Vicryl suture and the parapatellar arthrotomy, 0 and 2-0 undyed Vicryl suture and the subcutaneous tissue.  Skin staples and a dressing of Xeroform, 4 x 4, dressing  sponges, Webril and an Ace wrap. Tourniquet was let down.  The patient awakened, extubated and taken to the recovery room without difficulty.     Feliberto Gottron. Turner Daniels, M.D.     Frithjof.Angelica  D:  02/06/2011  T:  02/06/2011  Job:  [id number]  Electronically Signed by Gean Birchwood M.D. on 02/08/2011 01:28:29 PM

## 2011-02-09 LAB — CBC
Hemoglobin: 10.3 g/dL — ABNORMAL LOW (ref 12.0–15.0)
MCH: 29.5 pg (ref 26.0–34.0)
MCHC: 33 g/dL (ref 30.0–36.0)
Platelets: 183 10*3/uL (ref 150–400)
RBC: 3.49 MIL/uL — ABNORMAL LOW (ref 3.87–5.11)

## 2011-02-09 LAB — PROTIME-INR
INR: 1.48 (ref 0.00–1.49)
Prothrombin Time: 18.2 seconds — ABNORMAL HIGH (ref 11.6–15.2)

## 2011-02-11 ENCOUNTER — Emergency Department (HOSPITAL_COMMUNITY): Payer: Medicare Other

## 2011-02-11 ENCOUNTER — Emergency Department (HOSPITAL_COMMUNITY)
Admission: EM | Admit: 2011-02-11 | Discharge: 2011-02-11 | Disposition: A | Discharge status: Home / Self Care | Payer: Medicare Other | Attending: Emergency Medicine | Admitting: Emergency Medicine

## 2011-02-11 DIAGNOSIS — Z79899 Other long term (current) drug therapy: Secondary | ICD-10-CM | POA: Insufficient documentation

## 2011-02-11 DIAGNOSIS — Y9210 Unspecified residential institution as the place of occurrence of the external cause: Secondary | ICD-10-CM | POA: Insufficient documentation

## 2011-02-11 DIAGNOSIS — M25569 Pain in unspecified knee: Secondary | ICD-10-CM | POA: Insufficient documentation

## 2011-02-11 DIAGNOSIS — F3289 Other specified depressive episodes: Secondary | ICD-10-CM | POA: Insufficient documentation

## 2011-02-11 DIAGNOSIS — Z96659 Presence of unspecified artificial knee joint: Secondary | ICD-10-CM | POA: Insufficient documentation

## 2011-02-11 DIAGNOSIS — F329 Major depressive disorder, single episode, unspecified: Secondary | ICD-10-CM | POA: Insufficient documentation

## 2011-02-11 DIAGNOSIS — M129 Arthropathy, unspecified: Secondary | ICD-10-CM | POA: Insufficient documentation

## 2011-02-11 DIAGNOSIS — Z7901 Long term (current) use of anticoagulants: Secondary | ICD-10-CM | POA: Insufficient documentation

## 2011-02-11 DIAGNOSIS — W2203XA Walked into furniture, initial encounter: Secondary | ICD-10-CM | POA: Insufficient documentation

## 2011-02-11 DIAGNOSIS — S8000XA Contusion of unspecified knee, initial encounter: Secondary | ICD-10-CM | POA: Insufficient documentation

## 2011-02-11 LAB — PROTIME-INR: Prothrombin Time: 18 seconds — ABNORMAL HIGH (ref 11.6–15.2)

## 2011-02-13 NOTE — Discharge Summary (Signed)
NAMEJADYN, [name]              ACCOUNT NO.:  [id number]  MEDICAL RECORD NO.:  [id number]  LOCATION:  5022                         FACILITY:  [facility]  PHYSICIAN:  Feliberto Gottron. Turner Daniels, M.D.   DATE OF BIRTH:  12-07-1950  DATE OF ADMISSION:  02/06/2011 DATE OF DISCHARGE:  02/09/2011                              DISCHARGE SUMMARY   CHIEF COMPLAINT:  Right knee pain.  HISTORY OF PRESENT ILLNESS:  This is a 60 year old lady who complains of severe unremitting pain in her right knee despite extensive conservative treatment.  She has history of successful left total knee arthroplasty and now desires right.  All risks and benefits of surgery were discussed with the patient.  PAST MEDICAL HISTORY:  Significant for depression.  PAST SURGICAL HISTORY:  Significant for left total knee arthroplasty.  FAMILY HISTORY:  Noncontributory.  She quit smoking several years ago and denies use of alcohol.  No known drug allergies.  PHYSICAL EXAMINATION:  Gross examination of the right knee demonstrates a varus deformity.  Range of motion is 5-100 degrees.  She is tender to palpation along the medial joint line.  X-rays of the right knee demonstrate end-stage arthritis of the medial compartment.  LABS:  White blood cells 8.7, red blood cells 3.81, hemoglobin 11.1, hematocrit 33.5, platelets 191,000.  PT 13.5, INR 1.01.  Sodium 136, potassium 3.7, chloride 104, glucose 132, BUN 7, creatinine 0.72. Urinalysis was within limits.  HOSPITAL COURSE:  At this point, she was admitted to [facility] on February 06, 2011 where she underwent right total knee arthroplasty.  The procedure was performed by Dr. Gean Birchwood and the patient tolerated it well.  Two Hemovac drains were placed into the right knee.  A perioperative Foley catheter was also placed.  She was transferred to the floor on Lovenox and Coumadin for DVT prophylaxis.  On the first postoperative day, she was awake and alert and reporting good  pain control.  Hemoglobin was 11.1.  Her drains were removed without difficulty.  The Foley catheter was taken out after physical therapy. On the second postoperative day, she reported improvement in her knee pain and was progressing slowly with physical therapy.  Hemoglobin was 10.5.  A surgical dressing was changed and her incision was found to be benign.  On postoperative day #3, she was medically stable, but still making slow progress with therapy.  So she was discharged to a skilled nursing facility.  DISPOSITION:  The patient will be discharged to skilled nursing on February 09, 2011.  She is weightbearing as tolerated and will remain on Coumadin for a total of 2 weeks with a target INR of 1.5-2.0.  Her wound and Coumadin and physical therapy will also be managed by skilled nursing. She will return to the clinic for followup in 10-12 days for x-rays and staple removal.  FINAL DIAGNOSIS:  End-stage degenerative joint disease of the right knee.     Shirl Harris, PA   ______________________________ Feliberto Gottron. Turner Daniels, M.D.    JW/MEDQ  D:  02/08/2011  T:  02/08/2011  Job:  [id number]  Electronically Signed by Shirl Harris PA on 02/12/2011 12:57:59 PM Electronically Signed by Gean Birchwood M.D. on 02/13/2011  06:34:38 PM

## 2011-03-25 ENCOUNTER — Encounter: Payer: Self-pay | Admitting: Emergency Medicine

## 2011-03-25 ENCOUNTER — Emergency Department (HOSPITAL_COMMUNITY)
Admission: EM | Admit: 2011-03-25 | Discharge: 2011-03-26 | Disposition: A | Discharge status: Home / Self Care | Payer: Medicare Other | Attending: Emergency Medicine | Admitting: Emergency Medicine

## 2011-03-25 ENCOUNTER — Emergency Department (HOSPITAL_COMMUNITY): Payer: Medicare Other

## 2011-03-25 DIAGNOSIS — M79642 Pain in left hand: Secondary | ICD-10-CM

## 2011-03-25 DIAGNOSIS — M79609 Pain in unspecified limb: Secondary | ICD-10-CM | POA: Insufficient documentation

## 2011-03-25 NOTE — ED Notes (Signed)
PT. REPORTS PAIN AT LEFT HAND /FINGERS WHILE COOKING THIS EVENING , DENIES INURY  , UNRELIEVED BY OTC PAIN MEDICATION.

## 2011-03-26 MED ORDER — NAPROXEN 500 MG PO TABS: 500.0000 mg | ORAL_TABLET | Freq: Two times a day (BID) | ORAL | Status: AC

## 2011-03-26 MED ORDER — KETOROLAC TROMETHAMINE 60 MG/2ML IM SOLN
60.0000 mg | Freq: Once | INTRAMUSCULAR | Status: AC
  Administered 2011-03-26: 60 mg via INTRAMUSCULAR
  Filled 2011-03-26: qty 2

## 2011-03-26 NOTE — ED Provider Notes (Signed)
History     CSN: [id number] Arrival date & time: 03/25/2011  9:07 PM   First MD Initiated Contact with Patient 03/26/11 0051      Chief Complaint  Patient presents with  . Hand Pain    (Consider location/radiation/quality/duration/timing/severity/associated sxs/prior treatment) HPI Comments: 60 year old female with a history of a recent knee replacement who presents with left hand pain. This was acute in onset at 3 PM, intermittent, worse with moving her hand in a grip-like fashion. No associated swelling redness fevers or nausea. She denies numbness or tingling to the fingers. The symptoms completely improved with oxycodone but seems to have returned as the medicine as worn off. She does have a remote history of injury to her left middle finger though this was many many decades ago. Symptoms are currently mild  Patient is a 60 y.o. female presenting with hand pain. The history is provided by the patient.  Hand Pain    History reviewed. No pertinent past medical history.  Past Surgical History  Procedure Date  . Total knee arthroplasty     No family history on file.  History  Substance Use Topics  . Smoking status: Never Smoker   . Smokeless tobacco: Not on file  . Alcohol Use: No    OB History    Grav Para Term Preterm Abortions TAB SAB Ect Mult Living                  Review of Systems  Musculoskeletal: Negative for joint swelling.  Skin: Negative for rash.  Neurological: Negative for numbness.    Allergies  Codeine and Penicillins  Home Medications   Current Outpatient Rx  Name Route Sig Dispense Refill  . OXYCODONE-ACETAMINOPHEN 5-325 MG PO TABS Oral Take 1 tablet by mouth every 4 (four) hours as needed. For pain     . NAPROXEN 500 MG PO TABS Oral Take 1 tablet (500 mg total) by mouth 2 (two) times daily with a meal. 30 tablet 0    BP 118/67  Pulse 90  Temp(Src) 98 F (36.7 C) (Oral)  Resp 19  SpO2 98%  Physical Exam  Nursing note and vitals  reviewed. Constitutional: She appears well-developed and well-nourished. No distress.  HENT:  Head: Normocephalic and atraumatic.  Eyes: Conjunctivae are normal. No scleral icterus.  Cardiovascular: Normal rate, regular rhythm and intact distal pulses.   Pulmonary/Chest: Effort normal and breath sounds normal.  Musculoskeletal: She exhibits tenderness ( Mild tenderness over the left hand MCP joints. No redness swelling or induration of the skin. Normal grip and range of motion of the fingers, slight sclerosis and contracture of the left middle finger). She exhibits no edema.  Neurological: She is alert.  Skin: Skin is warm and dry. No rash noted. She is not diaphoretic.    ED Course  Procedures (including critical care time)  Labs Reviewed - No data to display Dg Hand Complete Left  03/25/2011  *RADIOLOGY REPORT*  Clinical Data: Left pain and swelling.  History of prior infection the third finger requiring surgery.  LEFT HAND - COMPLETE 3+ VIEW  Comparison: Left index finger radiographs 01/02/2010  Findings: Fusion of the proximal interphalangeal joint of the third finger is stable compared to prior radiographs of 2011.  Slight irregularity of the ulnar side of the base the proximal phalanx of the left index finger is unchanged from 2011, likely reflecting prior injury.  The joints of the hand are aligned.  No acute or healing fracture is identified.  No bony destruction is seen. There may be generalized swelling of the digits.  No soft tissue gas or radiopaque foreign body.  IMPRESSION:  1.  No acute bony abnormality is identified. Question mild diffuse soft tissue swelling.  2.  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  Original Report Authenticated By: Britta Mccreedy, M.D.     1. Hand pain, left       MDM  Overall patient appears well, x-rays show no signs of bony abnormalities,  slight soft tissue swelling but on clinical exam not consistent with infectious etiology or joint effusion. No focal joint to suggest acute gouty arthritis, anti-inflammatory given intramuscular, home with Naprosyn. I have discussed with patient the need for followup with orthopedic should her symptoms continue or gradually worsen or the emergency department should he become acutely worse        Vida Roller, MD 03/26/11 [phone]

## 2011-03-26 NOTE — ED Notes (Signed)
Pt reports acute onset of left hand pain without unknown cause. Pt denies any trauma or injury to hand, palpable radial pulse with full ROM. Pt states pain is constant, 5/10 dull pain. Pt INAD, CNS intact, skin w/d and resp e/u.

## 2011-03-26 NOTE — ED Notes (Signed)
Pt reports decrease in pain and denies any questions upon discharge.

## 2011-04-01 ENCOUNTER — Ambulatory Visit: Payer: Medicare Other | Admitting: Rehabilitative and Restorative Service Providers"

## 2011-04-18 ENCOUNTER — Ambulatory Visit: Payer: Medicare Other | Admitting: Rehabilitative and Restorative Service Providers"

## 2011-04-23 ENCOUNTER — Ambulatory Visit: Payer: Medicare Other | Attending: Orthopedic Surgery | Admitting: Rehabilitative and Restorative Service Providers"

## 2011-04-23 DIAGNOSIS — M25569 Pain in unspecified knee: Secondary | ICD-10-CM | POA: Insufficient documentation

## 2011-04-23 DIAGNOSIS — IMO0001 Reserved for inherently not codable concepts without codable children: Secondary | ICD-10-CM | POA: Insufficient documentation

## 2011-04-25 ENCOUNTER — Ambulatory Visit: Payer: Medicare Other | Admitting: Physical Therapy

## 2011-04-29 ENCOUNTER — Ambulatory Visit: Payer: Medicare Other | Admitting: Physical Therapy

## 2011-05-01 ENCOUNTER — Encounter: Payer: Medicare Other | Admitting: Physical Therapy

## 2011-05-06 ENCOUNTER — Encounter: Payer: Medicare Other | Admitting: Rehabilitative and Restorative Service Providers"

## 2011-05-08 ENCOUNTER — Encounter: Payer: Medicare Other | Admitting: Rehabilitative and Restorative Service Providers"

## 2011-05-13 ENCOUNTER — Ambulatory Visit: Payer: Medicare Other | Admitting: Rehabilitative and Restorative Service Providers"

## 2011-06-11 ENCOUNTER — Ambulatory Visit: Payer: Medicare Other | Admitting: Physical Therapy

## 2012-12-11 ENCOUNTER — Emergency Department (HOSPITAL_COMMUNITY)
Admission: EM | Admit: 2012-12-11 | Discharge: 2012-12-11 | Disposition: A | Discharge status: Home / Self Care | Payer: Medicare Other | Attending: Emergency Medicine | Admitting: Emergency Medicine

## 2012-12-11 ENCOUNTER — Encounter (HOSPITAL_COMMUNITY): Payer: Self-pay | Admitting: *Deleted

## 2012-12-11 ENCOUNTER — Emergency Department (HOSPITAL_COMMUNITY): Payer: Medicare Other

## 2012-12-11 DIAGNOSIS — Z880 Allergy status to penicillin: Secondary | ICD-10-CM | POA: Insufficient documentation

## 2012-12-11 DIAGNOSIS — J42 Unspecified chronic bronchitis: Secondary | ICD-10-CM

## 2012-12-11 MED ORDER — ALBUTEROL SULFATE HFA 108 (90 BASE) MCG/ACT IN AERS
2.0000 | INHALATION_SPRAY | RESPIRATORY_TRACT | Status: DC | PRN
  Administered 2012-12-11: 2 via RESPIRATORY_TRACT
  Filled 2012-12-11: qty 6.7

## 2012-12-11 MED ORDER — DEXAMETHASONE SODIUM PHOSPHATE 4 MG/ML IJ SOLN
10.0000 mg | Freq: Once | INTRAMUSCULAR | Status: AC
  Administered 2012-12-11: 10 mg via INTRAMUSCULAR
  Filled 2012-12-11: qty 3

## 2012-12-11 MED ORDER — ALBUTEROL SULFATE HFA 108 (90 BASE) MCG/ACT IN AERS: 1.0000 | INHALATION_SPRAY | Freq: Four times a day (QID) | RESPIRATORY_TRACT | Status: DC | PRN

## 2012-12-11 NOTE — ED Provider Notes (Signed)
CSN: [id number]     Arrival date & time 12/11/12  1647 History   First MD Initiated Contact with Patient 12/11/12 1812     Chief Complaint  Patient presents with  . Cough    HPI Patient with cough and wheezing since Tuesday.  No fever or chills.  Patient is a smoker but has decreased her smoking recently.  Sputum has been clear or white.   History reviewed. No pertinent past medical history. Past Surgical History  Procedure Laterality Date  . Total knee arthroplasty     No family history on file. History  Substance Use Topics  . Smoking status: Never Smoker   . Smokeless tobacco: Not on file  . Alcohol Use: No   OB History   Grav Para Term Preterm Abortions TAB SAB Ect Mult Living                 Review of Systems All other systems reviewed and are negative Allergies  Codeine and Penicillins  Home Medications   Current Outpatient Rx  Name  Route  Sig  Dispense  Refill  . DM-Phenylephrine-Acetaminophen (ALKA-SELTZER PLUS DAY COLD/FLU) 10-5-325 MG CAPS   Oral   Take 2 capsules by mouth daily as needed (for cold).         Marland Kitchen albuterol (PROVENTIL HFA;VENTOLIN HFA) 108 (90 BASE) MCG/ACT inhaler   Inhalation   Inhale 1-2 puffs into the lungs every 6 (six) hours as needed for wheezing.   1 Inhaler   0    BP 121/78  Pulse 82  Temp(Src) 98.7 F (37.1 C)  Resp 22  SpO2 96% Physical Exam  Nursing note and vitals reviewed. Constitutional: She is oriented to person, place, and time. She appears well-developed and well-nourished. No distress.  HENT:  Head: Normocephalic and atraumatic.  Eyes: Pupils are equal, round, and reactive to light.  Neck: Normal range of motion.  Cardiovascular: Normal rate and intact distal pulses.   Pulmonary/Chest: No respiratory distress. She has wheezes (Scattered throughout lung fields). She has no rales. She exhibits no tenderness.  Abdominal: Normal appearance. She exhibits no distension.  Musculoskeletal: Normal range of motion.   Neurological: She is alert and oriented to person, place, and time. No cranial nerve deficit.  Skin: Skin is warm and dry. No rash noted.  Psychiatric: She has a normal mood and affect. Her behavior is normal.    ED Course  Procedures (including critical care time) Labs Review Labs Reviewed - No data to display  Medications  dexamethasone (DECADRON) injection 10 mg (not administered)  albuterol (PROVENTIL HFA;VENTOLIN HFA) 108 (90 BASE) MCG/ACT inhaler 2 puff (not administered)    Imaging Review Dg Chest 2 View  12/11/2012   *RADIOLOGY REPORT*  Clinical Data: Cough, congestion, smoker  CHEST - 2 VIEW  Comparison: 07/06/2010  Findings: Cardiomediastinal silhouette is stable.  No acute infiltrate or pleural effusion.  No pulmonary edema.  Central mild bronchitic changes.  Stable degenerative changes thoracic spine.  IMPRESSION: No acute infiltrate or pulmonary edema.  Central mild bronchitic changes.   Original Report Authenticated By: Natasha Mead, M.D.    MDM   1. Chronic bronchitis        Nelia Shi, MD 12/11/12 [phone]

## 2012-12-11 NOTE — ED Notes (Signed)
The pt has had a cold and cough since Tuesday.  Unsure if she has had a temp.  Productive cough white sputum.  Hoarse cough

## 2014-10-05 ENCOUNTER — Encounter (HOSPITAL_COMMUNITY): Payer: Self-pay

## 2014-10-05 ENCOUNTER — Emergency Department (HOSPITAL_COMMUNITY)
Admission: EM | Admit: 2014-10-05 | Discharge: 2014-10-05 | Disposition: A | Discharge status: Home / Self Care | Payer: Medicare Other | Attending: Emergency Medicine | Admitting: Emergency Medicine

## 2014-10-05 ENCOUNTER — Emergency Department (HOSPITAL_COMMUNITY): Payer: Medicare Other

## 2014-10-05 DIAGNOSIS — Z720 Tobacco use: Secondary | ICD-10-CM | POA: Insufficient documentation

## 2014-10-05 DIAGNOSIS — Z880 Allergy status to penicillin: Secondary | ICD-10-CM | POA: Diagnosis not present

## 2014-10-05 DIAGNOSIS — Y999 Unspecified external cause status: Secondary | ICD-10-CM | POA: Diagnosis not present

## 2014-10-05 DIAGNOSIS — Z79899 Other long term (current) drug therapy: Secondary | ICD-10-CM | POA: Insufficient documentation

## 2014-10-05 DIAGNOSIS — Z8739 Personal history of other diseases of the musculoskeletal system and connective tissue: Secondary | ICD-10-CM | POA: Insufficient documentation

## 2014-10-05 DIAGNOSIS — Y929 Unspecified place or not applicable: Secondary | ICD-10-CM | POA: Diagnosis not present

## 2014-10-05 DIAGNOSIS — W010XXA Fall on same level from slipping, tripping and stumbling without subsequent striking against object, initial encounter: Secondary | ICD-10-CM | POA: Diagnosis not present

## 2014-10-05 DIAGNOSIS — Y939 Activity, unspecified: Secondary | ICD-10-CM | POA: Insufficient documentation

## 2014-10-05 DIAGNOSIS — M79641 Pain in right hand: Secondary | ICD-10-CM

## 2014-10-05 DIAGNOSIS — W19XXXA Unspecified fall, initial encounter: Secondary | ICD-10-CM

## 2014-10-05 DIAGNOSIS — S6991XA Unspecified injury of right wrist, hand and finger(s), initial encounter: Secondary | ICD-10-CM | POA: Diagnosis not present

## 2014-10-05 HISTORY — DX: Unspecified osteoarthritis, unspecified site: M19.90

## 2014-10-05 MED ORDER — OXYCODONE-ACETAMINOPHEN 5-325 MG PO TABS
1.0000 | ORAL_TABLET | Freq: Once | ORAL | Status: AC
  Administered 2014-10-05: 1 via ORAL
  Filled 2014-10-05: qty 1

## 2014-10-05 MED ORDER — MELOXICAM 15 MG PO TABS: 15.0000 mg | ORAL_TABLET | Freq: Every day | ORAL | Status: DC

## 2014-10-05 MED ORDER — HYDROCODONE-ACETAMINOPHEN 5-325 MG PO TABS
1.0000 | ORAL_TABLET | Freq: Once | ORAL | Status: DC
  Filled 2014-10-05: qty 1

## 2014-10-05 NOTE — ED Provider Notes (Signed)
CSN: [id number]     Arrival date & time 10/05/14  2034 History   This chart was scribed for Dierdre Forth, PA-C working with No att. providers found by Elveria Rising, ED Scribe. This patient was seen in room TR06C/TR06C and the patient's care was started at 8:48 PM.   Chief Complaint  Patient presents with  . Hand Injury   The history is provided by the patient and medical records. No language interpreter was used.   HPI Comments: [name] is a 64 y.o. female who presents to the Emergency Department with right hand injury incurred today when slipping and falling on wet floor. Patient reports attempting to brace herself with her hands and hyper extended right 2-5 digits on landing. She denies hitting her head or loss of consciousness. Patient is complaining of of pain located at MCPs on dorsal and ventral aspects of her hand. Patient applied ice to hand prior to arrival. Patient denies right wrist, elbow or shoulder involvement. He reports she can move all fingers of the right hand but has pain with motion.  Past Medical History  Diagnosis Date  . Arthritis    Past Surgical History  Procedure Laterality Date  . Total knee arthroplasty    . Hand surgery Left    No family history on file. History  Substance Use Topics  . Smoking status: Current Every Day Smoker -- 2.00 packs/day    Types: Cigarettes  . Smokeless tobacco: Not on file  . Alcohol Use: No   OB History    No data available     Review of Systems  Constitutional: Negative for fever and chills.  Gastrointestinal: Negative for nausea and vomiting.  Musculoskeletal: Positive for joint swelling and arthralgias. Negative for back pain, neck pain and neck stiffness.  Skin: Negative for color change and wound.  Neurological: Negative for numbness.  Hematological: Does not bruise/bleed easily.  Psychiatric/Behavioral: The patient is not nervous/anxious.   All other systems reviewed and are negative.  Allergies   Codeine and Penicillins  Home Medications   Prior to Admission medications   Medication Sig Start Date End Date Taking? Authorizing Provider  albuterol (PROVENTIL HFA;VENTOLIN HFA) 108 (90 BASE) MCG/ACT inhaler Inhale 1-2 puffs into the lungs every 6 (six) hours as needed for wheezing. 12/11/12   Nelva Nay, MD  DM-Phenylephrine-Acetaminophen (ALKA-SELTZER PLUS DAY COLD/FLU) 10-5-325 MG CAPS Take 2 capsules by mouth daily as needed (for cold).    Historical Provider, MD  meloxicam (MOBIC) 15 MG tablet Take 1 tablet (15 mg total) by mouth daily. 10/05/14   Thatiana Renbarger, PA-C   Triage Vitals: BP 142/83 mmHg  Pulse 86  Temp(Src) 98.3 F (36.8 C) (Oral)  Resp 19  SpO2 96% Physical Exam  Constitutional: She appears well-developed and well-nourished. No distress.  HENT:  Head: Normocephalic and atraumatic.  Eyes: Conjunctivae are normal.  Neck: Normal range of motion.  Cardiovascular: Normal rate, regular rhythm and intact distal pulses.   Capillary refill < 3 sec  Pulmonary/Chest: Effort normal and breath sounds normal.  Musculoskeletal: She exhibits tenderness. She exhibits no edema.  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  Neurological: She is alert. Coordination normal.  Sensation intact to dull and sharp in the bilateral upper extremities Strength 5/5 in the bilateral upper extremities with pain in the right hand on grip  strength  Skin: Skin is warm and dry. She is not diaphoretic.  No tenting of the skin  Psychiatric: She has a normal mood and affect.  Nursing note and vitals reviewed.   ED Course  Procedures (including critical care time)  COORDINATION OF CARE: 8:54 PM- Discussed treatment plan with patient at bedside and patient agreed to plan.   Labs Review Labs Reviewed - No data to display  Imaging  Review Dg Hand Complete Right  10/05/2014   CLINICAL DATA:  Larey Seat today, slipped on water, pain at the base of fingers  EXAM: RIGHT HAND - COMPLETE 3+ VIEW  COMPARISON:  None  FINDINGS: Three views of the right hand submitted. No acute fracture or subluxation. There is flexed position of proximal interphalangeal joint fifth finger. No radiopaque foreign body.  IMPRESSION: No acute fracture or subluxation. There is flexed position of proximal interphalangeal joint fifth finger.   Electronically Signed   By: Natasha Mead M.D.   On: 10/05/2014 21:21     EKG Interpretation None      MDM   Final diagnoses:  Fall  Right hand pain   [name] presents with right hand pain after fall.  Pt without LOC, neck pain, back pain.  Patient X-Ray negative for obvious fracture or dislocation. Pain managed in ED. Pt advised to follow up with orthopedics if symptoms persist for possibility of missed fracture diagnosis. Patient given brace while in ED, conservative therapy recommended and discussed. Patient will be dc home & is agreeable with above plan.  BP 121/103 mmHg  Pulse 68  Temp(Src) 98.3 F (36.8 C) (Oral)  Resp 18  SpO2 100%  I personally performed the services described in this documentation, which was scribed in my presence. The recorded information has been reviewed and is accurate.    Dahlia Client Vina Byrd, PA-C 10/06/14 0206  Vanetta Mulders, MD 10/09/14 1414

## 2014-10-05 NOTE — Progress Notes (Signed)
Orthopedic Tech Progress Note Patient Details:  [name] 1951-03-30 [id number] Applied Velcro wrist splint to RUE.  Pulses, sensation, motion intact before and after splinting.  Capillary refill less than 2 seconds before and after splinting. Ortho Devices Type of Ortho Device: Velcro wrist splint Ortho Device/Splint Location: RUE Ortho Device/Splint Interventions: Application   Lesle Chris 10/05/2014, 10:06 PM

## 2014-10-05 NOTE — ED Notes (Signed)
Pt states that she slipped and fell and all of the fingers on her right hand bent back.

## 2014-10-05 NOTE — ED Notes (Signed)
PA Hannah at bedside. 

## 2014-10-05 NOTE — Discharge Instructions (Signed)
1. Medications: mobic, usual home medications 2. Treatment: rest, drink plenty of fluids, ice, rest, use brace 3. Follow Up: Please followup with your primary doctor in 2-5 days for discussion of your diagnoses and further evaluation after today's visit; if you do not have a primary care doctor use the resource guide provided to find one;     Emergency Department Resource Guide 1) Find a Doctor and Pay Out of Pocket Although you won't have to find out who is covered by your insurance plan, it is a good idea to ask around and get recommendations. You will then need to call the office and see if the doctor you have chosen will accept you as a new patient and what types of options they offer for patients who are self-pay. Some doctors offer discounts or will set up payment plans for their patients who do not have insurance, but you will need to ask so you aren't surprised when you get to your appointment.  2) Contact Your Local Health Department Not all health departments have doctors that can see patients for sick visits, but many do, so it is worth a call to see if yours does. If you don't know where your local health department is, you can check in your phone book. The CDC also has a tool to help you locate your state's health department, and many state websites also have listings of all of their local health departments.  3) Find a Walk-in Clinic If your illness is not likely to be very severe or complicated, you may want to try a walk in clinic. These are popping up all over the country in pharmacies, drugstores, and shopping centers. They're usually staffed by nurse practitioners or physician assistants that have been trained to treat common illnesses and complaints. They're usually fairly quick and inexpensive. However, if you have serious medical issues or chronic medical problems, these are probably not your best option.  No Primary Care Doctor: - Call Health Connect at  [phone] - they can  help you locate a primary care doctor that  accepts your insurance, provides certain services, etc. - Physician Referral Service- [phone]  Chronic Pain Problems: Organization         Address  Phone   Notes  [facility]  [phone] Patients need to be referred by their primary care doctor.   Medication Assistance: Organization         Address  Phone   Notes  [facility] Medication [facility] [address] [place] [place], [place] [address] [phone] --Must be a resident of [facility] -- Must have NO insurance coverage whatsoever (no Medicaid/ Medicare, etc.) -- The pt. MUST have a primary care doctor that directs their care regularly and follows them in the community   [facility]ist  [phone]   [employer]  [phone]    Agencies that provide inexpensive medical care: Organization         Address  Phone   Notes  [facility]  [phone]   [facility]    [phone]   [facility] [address] [place], [place] [address] [phone]   [facility] [address] [place] [place] [phone]   Planned Parenthood    [phone]   [facility]    [phone]   Community Health and [facility]  [address], [place] Phone:  [phone],  Fax:  [phone] Hours of Operation:  9 am - 6 pm, M-F.  Also accepts Medicaid/Medicare and self-pay.  [facility] for [place], [address], [place] Phone: [phone], Fax: [phone]. Hours of Operation:  8:30 am - 5:30 pm, M-F.  Also accepts Medicaid and self-pay.  [facility] [place] [address], [place] Phone: [phone]   [place], [place], [place] [phone], Ext. [phone] Mondays & Thursdays: 7-9 AM.  First 15 patients are seen on a first come, first serve basis.    [place] Providers:  Organization         Address  Phone   Notes  [facility] [address], [place] [phone] Also accepts self-pay patients.  [facility] [address], [place]  [phone]   [place], [address], [place] [phone]   [facility] [address], [place] [phone]   Lucianne Lei [address], [place]   [phone] Only accepts [place] Access [place] patients after they have their name applied to their card.   Self-Pay (no insurance) in [facility]:  Organization         Address  Phone   Notes  Sickle Cell Patients, [facility] [place] [phone]   [facility] [place] [phone]   [facility] [place]  [place], [place], [place] [phone]   [facility]/Dr. Osei-Bonsu  [address], [place] or [address], [place] [phone] Phone number for both [place] and [place] locations is the same.  Urgent Medical and [facility] [address], [place] [phone]   [facility] [address] [place] or [address] [phone]   [facility] [address] [place] [phone], phone; [phone], fax Sees patients 1st and 3rd Saturday of every month.  Must not qualify for public or private insurance (i.e. Medicaid, Medicare, [place] Health Choice, Veterans' Benefits)  Household income should be no more than 200% of the poverty level The clinic cannot treat you if you are pregnant or think you are pregnant  Sexually transmitted diseases are not treated at the clinic.    Dental Care: Organization         Address  Phone  Notes  [facility] Department of [facility] [phone] Accepts children up to age 18 who are enrolled in [place] or [place]; pregnant women with a Medicaid card; and children who have applied for Medicaid or [place] Health Choice, but were declined, whose parents can pay a reduced fee at time of service.  [facility] Department of [facility]  [address], [place] [phone] Accepts children up to age 37 who are enrolled in [place] or [place]; pregnant women with a Medicaid card; and children who have applied for Medicaid or [place] Health Choice, but were declined, whose parents can pay a reduced fee at time of service.  [place] Adult Dental Access PROGRAM  [place] [phone] Patients are seen by appointment only. Walk-ins are not  accepted. [place] will see patients 45 years of age and older. Monday - Tuesday (8am-5pm) Most Wednesdays (8:30-5pm) $30 per visit, cash only  [facility] Adult Dental Access PROGRAM  [address], [facility] [phone] Patients are seen by appointment only. Walk-ins are not accepted. [place] will see patients 47 years of age and older. One Wednesday Evening (Monthly: Volunteer Based).  $30 per visit, cash only  [place]  [phone] for adults; Children under age 35, call Graduate Pediatric Dentistry at [phone]. Children aged 73-14, please call [phone] to request a pediatric application.  Dental services are provided in all areas of dental care including fillings, crowns and bridges, complete and partial dentures, implants, gum treatment, root canals, and extractions. Preventive care is also provided. Treatment is provided to both adults and children. Patients are selected via a lottery and there is often a waiting list.   [facility] [address] [place]  [phone] [email or web address]   [facility] [address] [place], [place] [phone], Ext. [phone] Second and Fourth Thursday of each month, opens at 6:30 AM; Clinic ends at 9 AM.  Patients are seen on a first-come first-served basis, and a limited number are seen during each clinic.   [facility]  [address] Hillard Danker [place], [place] [phone]   Eligibility Requirements You must have lived in [place], [place], or [place] counties for at least the last three months.   You cannot be eligible for state or federal sponsored [employer], including [employer], [place], or [employer].   You generally cannot be eligible for healthcare insurance through your employer.    How to apply: Eligibility screenings are held every Tuesday and Wednesday afternoon from 1:00 pm until 4:00 pm. You do not need an appointment for the interview!  [facility] [address], [place], [place]   [place]  [place] Department  [place]  [phone]    Behavioral Health Resources in the Community: Intensive Outpatient Programs Organization         Address  Phone  Notes  [place]. [address], [place], [place] [phone]   [facility] Outpatient [address] [place] [address] [place], [place]   ADS: Alcohol & Drug S[facility]cs [address] [place], [place]   [place] [address]  [place], [place] or [phone]   Substance Abuse Resources Organization         Address  Phone  Notes  Alcohol and Drug Services  [phone]   [place]  [phone]   [facility] [place]   Chinita Pester  [phone]   Residential & Outpatient Substance Abuse Program  [phone]   Psychological Services Organization         Address  Phone  Notes  [facility] [address] [place]  [place]  [phone]   [place] [address], [place] or [phone]    Mobile Crisis Teams Organization         Address  Phone  Notes  [facility], Mobile Crisis Care Unit  [phone]   [facility]  [address] [place], [place]   [facility] [address] [place] [phone]    Self-Help/Support Groups  Organization         Address  Phone             Notes  [facility]ental [facility] of [place] - variety of support groups  336- I7437963 Call for more information  Narcotics Anonymous (NA), [facility] [address], [employer] [place]  2 meetings at this location   Statistician         Address  Phone  Notes  [facility] [address],    [place]  [phone]   [facility]  [address] [place] [address], [place], [place] [phone]   [facility] [address] [place], [place] [phone] Admissions: 8am-3pm M-F  [facility] [address]    [place], [place] [phone]   [facility] [address] [place], [place], [place] [phone]   [facility] [address]  [place], [place] [phone]   [facility] - Intensive Outpatient [address], [place], [place] [phone]   [facility] (Addiction Recovery Care [facility] [address] [place].,  [place], [place] [phone] or [phone]   [facility] (RTS) [address], [place], [place] [phone] Accepts Medicaid  [facility] [address]  [place] [phone] Substance Abuse/Addiction Treatment   [facility] Organization         Address  Phone  Notes  [facility]  ([phone]   Angie Fava, PhD [address] Ervin Knack [place], [place]   ([phone] or  [phone]   [facility]al   [address] [place], [place] [phone]   [facility] [address], [place], [place] [phone] Insurance/Medicaid/sponsorship through [facility] and Families [address]                                    [place], [place] [phone] Therapy/tele-psych/case  [facility] [address], [place] [phone]    Dr. Lolly Mustache  [phone]   [facility]  [employer] [facility] Dept. 1) [address], [place] 2) [address] [place] 3)  [address], [place] [phone]  [phone]   [facility] Child Abuse Hotline (940) 020-9666 or [phone] (After Hours)

## 2014-12-28 ENCOUNTER — Ambulatory Visit (HOSPITAL_COMMUNITY)
Admission: RE | Admit: 2014-12-28 | Discharge: 2014-12-28 | Disposition: A | Discharge status: Home / Self Care | Payer: Medicare Other | Source: Ambulatory Visit | Attending: Family Medicine | Admitting: Family Medicine

## 2014-12-28 ENCOUNTER — Ambulatory Visit (INDEPENDENT_AMBULATORY_CARE_PROVIDER_SITE_OTHER): Payer: Medicare Other | Admitting: Family Medicine

## 2014-12-28 ENCOUNTER — Encounter: Payer: Self-pay | Admitting: Family Medicine

## 2014-12-28 ENCOUNTER — Other Ambulatory Visit: Payer: Self-pay

## 2014-12-28 ENCOUNTER — Telehealth: Payer: Self-pay | Admitting: Family Medicine

## 2014-12-28 VITALS — BP 129/82 | HR 81 | Temp 98.2°F | Ht 65.5 in | Wt 187.0 lb

## 2014-12-28 DIAGNOSIS — M542 Cervicalgia: Secondary | ICD-10-CM | POA: Insufficient documentation

## 2014-12-28 DIAGNOSIS — Z13220 Encounter for screening for lipoid disorders: Secondary | ICD-10-CM | POA: Diagnosis not present

## 2014-12-28 DIAGNOSIS — Z7189 Other specified counseling: Secondary | ICD-10-CM

## 2014-12-28 DIAGNOSIS — Z114 Encounter for screening for human immunodeficiency virus [HIV]: Secondary | ICD-10-CM | POA: Diagnosis not present

## 2014-12-28 DIAGNOSIS — Z7689 Persons encountering health services in other specified circumstances: Secondary | ICD-10-CM

## 2014-12-28 DIAGNOSIS — R75 Inconclusive laboratory evidence of human immunodeficiency virus [HIV]: Secondary | ICD-10-CM | POA: Diagnosis not present

## 2014-12-28 DIAGNOSIS — Z1231 Encounter for screening mammogram for malignant neoplasm of breast: Secondary | ICD-10-CM

## 2014-12-28 DIAGNOSIS — E785 Hyperlipidemia, unspecified: Secondary | ICD-10-CM | POA: Diagnosis not present

## 2014-12-28 DIAGNOSIS — M25512 Pain in left shoulder: Secondary | ICD-10-CM

## 2014-12-28 DIAGNOSIS — F17210 Nicotine dependence, cigarettes, uncomplicated: Secondary | ICD-10-CM

## 2014-12-28 MED ORDER — MELOXICAM 15 MG PO TABS: 15.0000 mg | ORAL_TABLET | Freq: Every day | ORAL | Status: DC

## 2014-12-28 NOTE — Patient Instructions (Signed)
Thanks for coming in today.   We talked about:  1. You need a Colonoscopy - set this up yourself with the instructions provided 2. You need a Mammogram - set this up yourself with the instructions provided.  3. You need a screening CT of the Chest - You will be called with the appointment.  4. You need blood work for screening - schedule a lab appointment on your way out for any morning this week or next week that you are free and can get blood work doe. Do not eat breakfast or even coffee with creamer in it before getting your labs drawn.  5. Shoulder pain - you have pain coming from your neck and from your left shoulder. You need to get x-rays done today of your neck and shoulder. Take the mobic for pain relief in the meantime.  6. Schedule a follow up appointment in 2 weeks to discuss your neck and shoulder pain.   Thanks for letting us take care of you.   Sincerely,  Devota Pace, MD Family Medicine - PGY 2

## 2014-12-28 NOTE — Telephone Encounter (Signed)
Pt is now aware of her appt.

## 2014-12-28 NOTE — Telephone Encounter (Signed)
LMOVM. Please advise patient the CT scan to screen for lung cancer is scheduled for:  Friday, January 13, 2015 at 2:00 PM at [facility], 1st floor, Radiology Dept.   Please arrive 15 mins early. Please call [phone] if this appt does not work.

## 2014-12-29 ENCOUNTER — Ambulatory Visit: Payer: Medicare Other

## 2015-01-11 ENCOUNTER — Ambulatory Visit (INDEPENDENT_AMBULATORY_CARE_PROVIDER_SITE_OTHER): Payer: Medicare Other | Admitting: Family Medicine

## 2015-01-11 ENCOUNTER — Encounter: Payer: Self-pay | Admitting: Family Medicine

## 2015-01-11 VITALS — BP 126/90 | HR 60 | Temp 98.2°F | Wt 191.0 lb

## 2015-01-11 DIAGNOSIS — F17210 Nicotine dependence, cigarettes, uncomplicated: Secondary | ICD-10-CM

## 2015-01-11 DIAGNOSIS — E785 Hyperlipidemia, unspecified: Secondary | ICD-10-CM | POA: Diagnosis not present

## 2015-01-11 DIAGNOSIS — Z7189 Other specified counseling: Secondary | ICD-10-CM

## 2015-01-11 DIAGNOSIS — M25512 Pain in left shoulder: Secondary | ICD-10-CM

## 2015-01-11 DIAGNOSIS — R75 Inconclusive laboratory evidence of human immunodeficiency virus [HIV]: Secondary | ICD-10-CM

## 2015-01-11 DIAGNOSIS — Z7689 Persons encountering health services in other specified circumstances: Secondary | ICD-10-CM

## 2015-01-11 LAB — COMPREHENSIVE METABOLIC PANEL
ALBUMIN: 3.8 g/dL (ref 3.6–5.1)
ALT: 10 U/L (ref 6–29)
AST: 12 U/L (ref 10–35)
Alkaline Phosphatase: 95 U/L (ref 33–130)
BILIRUBIN TOTAL: 0.4 mg/dL (ref 0.2–1.2)
BUN: 12 mg/dL (ref 7–25)
CALCIUM: 9.7 mg/dL (ref 8.6–10.4)
CHLORIDE: 104 mmol/L (ref 98–110)
CO2: 29 mmol/L (ref 20–31)
Creat: 0.67 mg/dL (ref 0.50–0.99)
Glucose, Bld: 96 mg/dL (ref 65–99)
Potassium: 3.9 mmol/L (ref 3.5–5.3)
Sodium: 137 mmol/L (ref 135–146)
TOTAL PROTEIN: 6.9 g/dL (ref 6.1–8.1)

## 2015-01-11 LAB — LIPID PANEL
CHOLESTEROL: 153 mg/dL (ref 125–200)
HDL: 38 mg/dL — AB (ref >=46)
LDL CALC: 95 mg/dL (ref <130)
TRIGLYCERIDES: 101 mg/dL (ref <150)
Total CHOL/HDL Ratio: 4 Ratio (ref <=5.0)
VLDL: 20 mg/dL (ref <30)

## 2015-01-11 LAB — CBC
HEMATOCRIT: 43.7 % (ref 36.0–46.0)
Hemoglobin: 14.8 g/dL (ref 12.0–15.0)
MCH: 29.8 pg (ref 26.0–34.0)
MCHC: 33.9 g/dL (ref 30.0–36.0)
MCV: 88.1 fL (ref 78.0–100.0)
MPV: 9.5 fL (ref 8.6–12.4)
Platelets: 226 10*3/uL (ref 150–400)
RBC: 4.96 MIL/uL (ref 3.87–5.11)
RDW: 14.5 % (ref 11.5–15.5)
WBC: 7.6 10*3/uL (ref 4.0–10.5)

## 2015-01-11 MED ORDER — TRAMADOL HCL 50 MG PO TABS: 50.0000 mg | ORAL_TABLET | Freq: Three times a day (TID) | ORAL | Status: DC | PRN

## 2015-01-11 MED ORDER — IBUPROFEN 600 MG PO TABS: 600.0000 mg | ORAL_TABLET | Freq: Four times a day (QID) | ORAL | Status: DC | PRN

## 2015-01-11 MED ORDER — METHYLPREDNISOLONE ACETATE 40 MG/ML IJ SUSP
40.0000 mg | Freq: Once | INTRAMUSCULAR | Status: AC
  Administered 2015-01-11: 40 mg via INTRA_ARTICULAR

## 2015-01-11 NOTE — Assessment & Plan Note (Signed)
Consistent with rotator cuff pathology based on exam and imaging findings. Proceeded with subacromial injection today. Patient requested to be started on ibuprofen. Will discontinue mobic and start ibuprofen. Instructed patient to ice shoulder over the next 24-28 hours and avoid heavy exercise. Will also give tramadol for use as needed. Instructed patient to follow up in 4 weeks. If still not improving, may need referral to Sports Medicine or Orthopedics.

## 2015-01-11 NOTE — Patient Instructions (Signed)
Thank you for coming to the clinic today. It was nice seeing you.  We injected your shoulder today. It may be painful for the next 1-2 days. This is a normal reaction to the injection. If your pain is not improving in 2-3 days please let us know. We will send in for a prescription for tramadol and ibuprofen. Please continue taking your meloxicam until you pick up the ibuprofen.  Please come back in 4 weeks to follow up on your shoulder pain, or sooner as needed.  Take care,  Dr Jimmey Ralph

## 2015-01-11 NOTE — Progress Notes (Signed)
    Subjective:  [name] is a 64 y.o. female who presents to the [facility] today with a chief complaint of shoulder pain.   HPI:  Left Shoulder Pain Patient presents with left shoulder pain for the past month. No injuries or precipitating events. States that pain is "achey" and main located in the anterior aspect of her right shoulder. Also reports that she sometimes has pain that radiates down her arm into her left arm. Was seen in this clinic 2 weeks ago and started on daily Mobic. States that the mobic has not seemed to help much with her pain. Has also tried heat which has helped some. No weakness. Endorses occasional numbness in her middle finger while crocheting. Reports that her dexterity and function have remained normal. No prior issues with shoulder.   ROS: Per HPI   Objective:  Physical Exam: BP 126/90 mmHg  Pulse 60  Temp(Src) 98.2 F (36.8 C) (Oral)  Wt 191 lb (86.637 kg)  Gen: NAD, resting comfortably Lungs: NWOB MSK:  - Left shoulder: Tender to palpation over acromion. No deformities. FROM, though painful with abduction greater than 90 degrees. Strength 5/5 in all directions. Hawkins positive. Neers positive. Spurling negative. Skin: warm, dry Neuro: grossly normal, moves all extremities Psych: Normal affect and thought content  Imaging: Left shoulder xray: Mild acromial spurring.  Shoulder Injection Procedure Note  Pre-operative Diagnosis: left rotator cuff tendonitis   Post-operative Diagnosis: same  Indications: Symptomatic relief   Anesthesia: not required   Procedure Details   Verbal consent was obtained for the procedure. The shoulder was prepped with iodine and the skin was anesthetized. Using a 22 gauge needle the subacromial space is injected with 3 mL 1% lidocaine and 1 mL of depo-medrol under the posterior aspect of the acromion. The injection site was cleansed with topical isopropyl alcohol and a dressing was applied.  Complications:  None;  patient tolerated the procedure well.   Assessment/Plan:  Left shoulder pain Consistent with rotator cuff pathology based on exam and imaging findings. Proceeded with subacromial injection today. Patient requested to be started on ibuprofen. Will discontinue mobic and start ibuprofen. Instructed patient to ice shoulder over the next 24-28 hours and avoid heavy exercise. Will also give tramadol for use as needed. Instructed patient to follow up in 4 weeks. If still not improving, may need referral to Sports Medicine or Orthopedics.     Katina Degree. Jimmey Ralph, MD [facility] Family Medicine Resident PGY-2 01/11/2015 12:20 PM

## 2015-01-12 LAB — HIV ANTIBODY (ROUTINE TESTING W REFLEX): HIV: NONREACTIVE

## 2015-01-13 ENCOUNTER — Ambulatory Visit (HOSPITAL_COMMUNITY): Payer: Medicare Other

## 2015-01-16 ENCOUNTER — Telehealth: Payer: Self-pay | Admitting: Family Medicine

## 2015-01-16 NOTE — Telephone Encounter (Signed)
Called and left VM for normal lab results. Let her know to call back if she had any questions.   CGM MD

## 2015-01-16 NOTE — Progress Notes (Signed)
Patient ID: [name], female   DOB: April 21, 1950, 64 y.o.   MRN: [id number]   [facility] Yolande Jolly, MD Phone: [phone]  Subjective:   # Establish Care:  - PMH: asthma - PSH: noncontributory - PFH: noncontributory - Social: no ETOH, does not smoke, not sexually active - Meds: below.  - Allergies: penicillin, codeine  Health Maintenance:  - needs mammogram - needs colonoscopy - needs flu shot - does not want gyn / pap smear  # shoulder pain  - has been ongoing for some time.  - intermittent numbness / tingling in her left hand / fingers - worse with raising her arm over her head.  - no warmth, erythema, no swelling - has never injured it.    All relevant systems were reviewed and were negative unless otherwise noted in the HPI  Past Medical History Reviewed problem list.  Medications- reviewed and updated Current Outpatient Prescriptions  Medication Sig Dispense Refill  . albuterol (PROVENTIL HFA;VENTOLIN HFA) 108 (90 BASE) MCG/ACT inhaler Inhale 1-2 puffs into the lungs every 6 (six) hours as needed for wheezing. 1 Inhaler 0  . DM-Phenylephrine-Acetaminophen (ALKA-SELTZER PLUS DAY COLD/FLU) 10-5-325 MG CAPS Take 2 capsules by mouth daily as needed (for cold).    Marland Kitchen ibuprofen (ADVIL,MOTRIN) 600 MG tablet Take 1 tablet (600 mg total) by mouth every 6 (six) hours as needed. 30 tablet 0  . traMADol (ULTRAM) 50 MG tablet Take 1 tablet (50 mg total) by mouth every 8 (eight) hours as needed. 30 tablet 0   No current facility-administered medications for this visit.   Chief complaint-noted No additions to family history Social history- patient is a non smoker  Objective: BP 129/82 mmHg  Pulse 81  Temp(Src) 98.2 F (36.8 C) (Oral)  Ht 5' 5.5" (1.664 m)  Wt 187 lb (84.823 kg)  BMI 30.63 kg/m2 Gen: NAD, alert, cooperative with exam HEENT: NCAT, EOMI, PERRL Neck: FROM, supple CV: RRR, good S1/S2, no murmur Resp: CTABL, no wheezes,  non-labored Abd: SNTND, BS present, no guarding or organomegaly Ext: No edema, warm, normal tone, moves UE/LE spontaneously, left shoulder with some pain with palpation posteriorly, good active and passive rom, + spurlings sign. No weakness on that side.  Neuro: Alert and oriented, No gross deficits Skin: no rashes no lesions  Assessment/Plan:  Health Maintenance:   - schedule c-scope - schedule mammogram - flu shot today.  - screening labs.   Shoulder Pain: appears to be coming from her neck.  - will get x-rays today.  - follow up in 2 weeks for further evaluation / discussion of x-rays and options.  - consider pt as well.

## 2015-01-26 ENCOUNTER — Ambulatory Visit (HOSPITAL_COMMUNITY)
Admission: RE | Admit: 2015-01-26 | Discharge: 2015-01-26 | Disposition: A | Discharge status: Home / Self Care | Payer: Medicare Other | Source: Ambulatory Visit | Attending: Family Medicine | Admitting: Family Medicine

## 2015-01-26 DIAGNOSIS — F17210 Nicotine dependence, cigarettes, uncomplicated: Secondary | ICD-10-CM | POA: Insufficient documentation

## 2015-01-26 DIAGNOSIS — Z122 Encounter for screening for malignant neoplasm of respiratory organs: Secondary | ICD-10-CM | POA: Diagnosis not present

## 2015-01-26 DIAGNOSIS — J479 Bronchiectasis, uncomplicated: Secondary | ICD-10-CM | POA: Diagnosis not present

## 2015-01-26 DIAGNOSIS — R918 Other nonspecific abnormal finding of lung field: Secondary | ICD-10-CM | POA: Insufficient documentation

## 2015-01-31 ENCOUNTER — Telehealth: Payer: Self-pay | Admitting: Family Medicine

## 2015-01-31 NOTE — Telephone Encounter (Signed)
FYI: pt called and asked me to inform provider that she received the message and thank you. Sadie Reynolds, ASA

## 2015-01-31 NOTE — Telephone Encounter (Signed)
error 

## 2015-02-03 ENCOUNTER — Telehealth: Payer: Self-pay | Admitting: Family Medicine

## 2015-02-03 NOTE — Telephone Encounter (Signed)
Pt. Called and informed of CT findings. I discussed with her the small nodule noted on the CT scan. She needs a repeat CT scan in one year based on the recommendations. She acknowledged this and will follow up to make sure that she gets another scan in a year. No other red flag symptoms at this time.   CGM MD

## 2015-02-16 ENCOUNTER — Ambulatory Visit: Payer: Medicare Other | Admitting: Family Medicine

## 2015-03-16 ENCOUNTER — Ambulatory Visit (INDEPENDENT_AMBULATORY_CARE_PROVIDER_SITE_OTHER): Payer: Medicare Other | Admitting: Family Medicine

## 2015-03-16 ENCOUNTER — Encounter: Payer: Self-pay | Admitting: Family Medicine

## 2015-03-16 VITALS — BP 124/82 | HR 86 | Temp 98.0°F | Wt 190.8 lb

## 2015-03-16 DIAGNOSIS — S46912D Strain of unspecified muscle, fascia and tendon at shoulder and upper arm level, left arm, subsequent encounter: Secondary | ICD-10-CM

## 2015-03-16 NOTE — Patient Instructions (Signed)
Thanks for coming in today.   Continue the ibuprofen.You may take up to 600mg  every 6 hours.   I recommend that you get a deep tissue massage. This should help with the muscle pain.   I will prescribe a muscle relaxer for you.   Use heat to help with the pain as well. This will help to relax the muscle.   Follow up in one month. If your pain is worse or unchanged, then we will refer you to sports medicine or to orthopedics.   Thanks for trusting us to take care of you.   Sincerely,  Devota Pacealeb Draya Felker, MD Family Medicine -PGY 2

## 2015-03-16 NOTE — Progress Notes (Signed)
Patient ID: [name], female   DOB: [birth year], 64 y.o.   MRN: [id number]   [facility] Erin Jollyaleb G Melaysia Streed, MD Phone: [phone]  Subjective:   # F/U Shoulder Pain Left - Pt. Here for follow up of her left shoulder pain.  - Has gotten slightly better. Got an injection 9/28. This gave her relief for two weeks, and then her pain returned.  - no numbness, tingling, or weakness of her left arm.  - she denies limited range of motion.  - Pain is nonradiating and is located more over the superior aspect of her scapula and posterior neck, than actually in her shoulder.  - She says she has been using low dose ibuprofen with little relief.  - Heat helps the pain.  - It is worse with knitting for long periods of time.  - Not worsened by lifting.  - No joint redness, swelling, or fever, no recent weight loss.   All relevant systems were reviewed and were negative unless otherwise noted in the HPI  Past Medical History Reviewed problem list.  Medications- reviewed and updated Current Outpatient Prescriptions  Medication Sig Dispense Refill  . albuterol (PROVENTIL HFA;VENTOLIN HFA) 108 (90 BASE) MCG/ACT inhaler Inhale 1-2 puffs into the lungs every 6 (six) hours as needed for wheezing. 1 Inhaler 0  . DM-Phenylephrine-Acetaminophen (ALKA-SELTZER PLUS DAY COLD/FLU) 10-5-325 MG CAPS Take 2 capsules by mouth daily as needed (for cold).    Marland Kitchen. ibuprofen (ADVIL,MOTRIN) 600 MG tablet Take 1 tablet (600 mg total) by mouth every 6 (six) hours as needed. 30 tablet 0  . traMADol (ULTRAM) 50 MG tablet Take 1 tablet (50 mg total) by mouth every 8 (eight) hours as needed. 30 tablet 0   No current facility-administered medications for this visit.   Chief complaint-noted No additions to family history Social history- patient is a current smoker  Objective: BP 124/82 mmHg  Pulse 86  Temp(Src) 98 F (36.7 C) (Oral)  Wt 190 lb 12.8 oz (86.546 kg) Gen: NAD, alert, cooperative with  exam HEENT: NCAT, EOMI, PERRL Neck: FROM, supple CV: RRR, good S1/S2, no murmur Resp: CTABL, no wheezes, non-labored Abd: SNTND, BS present, no guarding or organomegaly Ext: No edema, warm, normal tone, moves UE/LE spontaneously Left Shoulder: FROM both active and passive, decent strength, positive neer's / hawkins, negative spurling's. No frank TTP over trapezius, but this is where she points and says her pain is.  Neuro: Alert and oriented, No gross deficits Skin: no rashes no lesions  Assessment/Plan:  Left Shoulder Pain - Pt. Knits for extended periods of time and this is when her shoulder pain is the worst. Likely muscular strain / spasm. No apparent intrinsic shoulder pain / dysfunction. X-rays with possible Rotator cuff pathology.  - Pt. With what appears to be more muscular pain.  - Rx Ibuprofen 600mg  q6 hrs.  - Deep tissue massage.  - Heat - Flexeril for muscle spasm.  - F/U in one month if no better, may consider MRI and possible referral to Sports Medicine.  - May consider PT in the future as well.

## 2015-03-23 ENCOUNTER — Emergency Department (HOSPITAL_COMMUNITY)
Admission: EM | Admit: 2015-03-23 | Discharge: 2015-03-23 | Disposition: A | Discharge status: Home / Self Care | Payer: No Typology Code available for payment source | Attending: Emergency Medicine | Admitting: Emergency Medicine

## 2015-03-23 ENCOUNTER — Encounter (HOSPITAL_COMMUNITY): Payer: Self-pay | Admitting: Emergency Medicine

## 2015-03-23 ENCOUNTER — Emergency Department (HOSPITAL_COMMUNITY): Payer: No Typology Code available for payment source

## 2015-03-23 DIAGNOSIS — Y92410 Unspecified street and highway as the place of occurrence of the external cause: Secondary | ICD-10-CM | POA: Insufficient documentation

## 2015-03-23 DIAGNOSIS — M1990 Unspecified osteoarthritis, unspecified site: Secondary | ICD-10-CM | POA: Diagnosis not present

## 2015-03-23 DIAGNOSIS — Z880 Allergy status to penicillin: Secondary | ICD-10-CM | POA: Insufficient documentation

## 2015-03-23 DIAGNOSIS — F17210 Nicotine dependence, cigarettes, uncomplicated: Secondary | ICD-10-CM | POA: Insufficient documentation

## 2015-03-23 DIAGNOSIS — Y9389 Activity, other specified: Secondary | ICD-10-CM | POA: Insufficient documentation

## 2015-03-23 DIAGNOSIS — Z79899 Other long term (current) drug therapy: Secondary | ICD-10-CM | POA: Insufficient documentation

## 2015-03-23 DIAGNOSIS — S8991XA Unspecified injury of right lower leg, initial encounter: Secondary | ICD-10-CM | POA: Diagnosis present

## 2015-03-23 DIAGNOSIS — S8001XA Contusion of right knee, initial encounter: Secondary | ICD-10-CM | POA: Diagnosis not present

## 2015-03-23 DIAGNOSIS — Y998 Other external cause status: Secondary | ICD-10-CM | POA: Diagnosis not present

## 2015-03-23 DIAGNOSIS — Z96653 Presence of artificial knee joint, bilateral: Secondary | ICD-10-CM | POA: Diagnosis not present

## 2015-03-23 MED ORDER — TRAMADOL HCL 50 MG PO TABS: 50.0000 mg | ORAL_TABLET | Freq: Three times a day (TID) | ORAL | Status: DC | PRN
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
## 2015-03-23 NOTE — ED Notes (Signed)
Pt requesting something for pain.  Clydie BraunKaren, PA-C informed.
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
## 2015-03-23 NOTE — Discharge Instructions (Signed)

## 2015-03-24 ENCOUNTER — Telehealth: Payer: Self-pay | Admitting: *Deleted
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
## 2015-03-28 ENCOUNTER — Telehealth: Payer: Self-pay | Admitting: Family Medicine

## 2015-03-28 NOTE — Telephone Encounter (Signed)
Will forward to MD.  Patient also called on 03/24/15 and was triaged by CMA. Braxdon Gappa,CMA
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
## 2015-03-29 ENCOUNTER — Ambulatory Visit (INDEPENDENT_AMBULATORY_CARE_PROVIDER_SITE_OTHER): Payer: Medicare Other | Admitting: Family Medicine

## 2015-03-29 VITALS — BP 120/75 | HR 73 | Temp 98.3°F

## 2015-03-29 DIAGNOSIS — M25561 Pain in right knee: Secondary | ICD-10-CM

## 2015-03-29 MED ORDER — IBUPROFEN 600 MG PO TABS: 600.0000 mg | ORAL_TABLET | Freq: Four times a day (QID) | ORAL | Status: DC | PRN

## 2015-03-29 NOTE — Telephone Encounter (Signed)
She has had a  Total knee replacement. She does not need a referral to be seen by her orthopedist. She can call their office to set up an appointment. She can see me for evaluation as well if she would like. Thanks  CGM MD

## 2015-03-29 NOTE — Patient Instructions (Signed)
Thanks for letting us take care of you.   I think that your knee will improve on its own.   There are no fractures based on the imaging.   Use ibuprofen for the pain.   Use heat and ice to help improve the swelling.   It might be helpful to send you to physical therapy to make sure that you are getting good exercise and range of motion with your knee.   Thanks for letting us take care of you.   Sincerely,  Devota Pacealeb Lupe Handley, MD

## 2015-03-29 NOTE — Telephone Encounter (Signed)
Patient is already scheduled to see us today in clinic.  Will discuss referral need then. Jazmin Hartsell,CMA
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
## 2015-04-16 HISTORY — PX: TOTAL KNEE ARTHROPLASTY: SHX125

## 2015-04-25 ENCOUNTER — Telehealth: Payer: Self-pay | Admitting: Family Medicine

## 2015-04-25 NOTE — Telephone Encounter (Signed)
Patient is scheduled for 05/03/15 at  8:30am with pcp. Jazmin Hartsell,CMA

## 2015-04-25 NOTE — Telephone Encounter (Signed)
Do you want to re-evaluate patient before sending her to physical therapy? [name],CMA

## 2015-04-25 NOTE — Telephone Encounter (Signed)
Yes, reevaluate if what we have been doing is not working. Have her come in in the next 2-4 weeks if it is not helping. Thanks  CGM MD

## 2015-04-25 NOTE — Telephone Encounter (Signed)
Wanted to know if her PT for her knee had been scheduled

## 2015-04-25 NOTE — Telephone Encounter (Signed)
Plan was for PT in the future if needed at her last office visit. Please let her know this. If she is still feeling significant pain, we can see her back in the office and address if needed. Thanks  CGM MD

## 2015-05-03 ENCOUNTER — Ambulatory Visit: Payer: Medicare Other | Admitting: Family Medicine

## 2015-05-10 ENCOUNTER — Other Ambulatory Visit: Payer: Self-pay | Admitting: Family Medicine

## 2015-05-10 ENCOUNTER — Ambulatory Visit: Payer: Medicare Other | Admitting: Family Medicine

## 2015-05-10 MED ORDER — IBUPROFEN 600 MG PO TABS: 600.0000 mg | ORAL_TABLET | Freq: Four times a day (QID) | ORAL | Status: DC | PRN

## 2015-05-10 NOTE — Telephone Encounter (Signed)
Will forward to MD. Izell Labat,CMA  

## 2015-05-10 NOTE — Telephone Encounter (Signed)
Pt was over an hour late for her appt. She would like to have some type of pain med for her knee. Was given 600 mg of ibuprophen and that worked but there are no refills. [facility] on [address].

## 2015-05-25 ENCOUNTER — Ambulatory Visit: Payer: Medicare Other | Admitting: Family Medicine

## 2015-06-12 ENCOUNTER — Encounter: Payer: Self-pay | Admitting: Family Medicine

## 2015-06-12 ENCOUNTER — Ambulatory Visit (INDEPENDENT_AMBULATORY_CARE_PROVIDER_SITE_OTHER): Payer: Medicare Other | Admitting: Family Medicine

## 2015-06-12 VITALS — BP 127/70 | HR 65 | Temp 97.7°F | Ht 66.0 in | Wt 193.2 lb

## 2015-06-12 DIAGNOSIS — T8484XS Pain due to internal orthopedic prosthetic devices, implants and grafts, sequela: Secondary | ICD-10-CM

## 2015-06-12 DIAGNOSIS — Z96659 Presence of unspecified artificial knee joint: Principal | ICD-10-CM

## 2015-06-12 NOTE — Patient Instructions (Signed)
Thanks for coming in today.   Continue the ibuprofen for your pain  You will be called with a follow up appointment for Orthopedics for a second opinion.    Let me know if you have any questions.   Thanks  Devota Pace, MD Family Medicine - PGY 2
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
## 2015-07-13 ENCOUNTER — Emergency Department (HOSPITAL_COMMUNITY)
Admission: EM | Admit: 2015-07-13 | Discharge: 2015-07-14 | Disposition: A | Discharge status: Home / Self Care | Payer: Medicare Other | Attending: Emergency Medicine | Admitting: Emergency Medicine

## 2015-07-13 ENCOUNTER — Emergency Department (HOSPITAL_COMMUNITY): Payer: Medicare Other

## 2015-07-13 ENCOUNTER — Encounter (HOSPITAL_COMMUNITY): Payer: Self-pay | Admitting: *Deleted

## 2015-07-13 DIAGNOSIS — F17210 Nicotine dependence, cigarettes, uncomplicated: Secondary | ICD-10-CM | POA: Insufficient documentation

## 2015-07-13 DIAGNOSIS — M1990 Unspecified osteoarthritis, unspecified site: Secondary | ICD-10-CM | POA: Diagnosis not present

## 2015-07-13 DIAGNOSIS — R079 Chest pain, unspecified: Secondary | ICD-10-CM | POA: Diagnosis present

## 2015-07-13 DIAGNOSIS — Z79899 Other long term (current) drug therapy: Secondary | ICD-10-CM | POA: Insufficient documentation

## 2015-07-13 LAB — BASIC METABOLIC PANEL
Anion gap: 8 (ref 5–15)
BUN: 10 mg/dL (ref 6–20)
CO2: 25 mmol/L (ref 22–32)
CREATININE: 0.81 mg/dL (ref 0.44–1.00)
Calcium: 9.3 mg/dL (ref 8.9–10.3)
Chloride: 105 mmol/L (ref 101–111)
GFR calc Af Amer: >60 mL/min (ref >60)
GLUCOSE: 106 mg/dL — AB (ref 65–99)
POTASSIUM: 4.5 mmol/L (ref 3.5–5.1)
Sodium: 138 mmol/L (ref 135–145)

## 2015-07-13 LAB — URINE MICROSCOPIC-ADD ON

## 2015-07-13 LAB — I-STAT TROPONIN, ED: Troponin i, poc: 0 ng/mL (ref 0.00–0.08)

## 2015-07-13 LAB — D-DIMER, QUANTITATIVE (NOT AT ARMC): D DIMER QUANT: 0.41 ug{FEU}/mL (ref 0.00–0.50)

## 2015-07-13 LAB — CBC
HEMATOCRIT: 44.6 % (ref 36.0–46.0)
Hemoglobin: 14.5 g/dL (ref 12.0–15.0)
MCH: 29.5 pg (ref 26.0–34.0)
MCHC: 32.5 g/dL (ref 30.0–36.0)
MCV: 90.8 fL (ref 78.0–100.0)
Platelets: 225 10*3/uL (ref 150–400)
RBC: 4.91 MIL/uL (ref 3.87–5.11)
RDW: 14.7 % (ref 11.5–15.5)
WBC: 10.1 10*3/uL (ref 4.0–10.5)

## 2015-07-13 LAB — URINALYSIS, ROUTINE W REFLEX MICROSCOPIC
Bilirubin Urine: NEGATIVE
Glucose, UA: NEGATIVE mg/dL
Hgb urine dipstick: NEGATIVE
Ketones, ur: NEGATIVE mg/dL
NITRITE: NEGATIVE
PH: 6.5 (ref 5.0–8.0)
Protein, ur: NEGATIVE mg/dL
SPECIFIC GRAVITY, URINE: 1.019 (ref 1.005–1.030)

## 2015-07-13 MED ORDER — ASPIRIN 81 MG PO CHEW
324.0000 mg | CHEWABLE_TABLET | Freq: Once | ORAL | Status: AC
  Administered 2015-07-13: 324 mg via ORAL
  Filled 2015-07-13: qty 4

## 2015-07-13 NOTE — ED Provider Notes (Signed)
CSN: [id number]     Arrival date & time 07/13/15  2006 History   First MD Initiated Contact with Patient 07/13/15 2124     Chief Complaint  Patient presents with  . Chest Pain      HPI 65 year old female with history only of osteoarthritis in both knees who presents with left-sided chest pain. Patient reports that the pain began several hours ago while she was smoking. Pain radiates to the left shoulder and left arm. Described as a sharp stabbing sensation. It is mild at baseline but intermittently becomes severe for a couple of minutes at a time. It is pleuritic and worse with deep inspiration. She denies shortness of breath. No history of DVT or PE. No prior cardiac history. Denies palpitations. No fevers or cough. Pain is not worse with exertion and not accompanied nausea or vomiting.  Past Medical History  Diagnosis Date  . Arthritis    Past Surgical History  Procedure Laterality Date  . Total knee arthroplasty    . Hand surgery Left    No family history on file. Social History  Substance Use Topics  . Smoking status: Current Every Day Smoker -- 2.00 packs/day    Types: Cigarettes  . Smokeless tobacco: None  . Alcohol Use: No   OB History    No data available     Review of Systems  Constitutional: Negative for fever, chills, activity change and appetite change.  HENT: Negative for congestion, rhinorrhea and sore throat.   Eyes: Negative for visual disturbance.  Respiratory: Negative for cough, shortness of breath and wheezing.   Cardiovascular: Positive for chest pain. Negative for palpitations.  Gastrointestinal: Negative for nausea, vomiting, abdominal pain and abdominal distention.  Genitourinary: Negative for dysuria, frequency and flank pain.  Musculoskeletal: Negative for myalgias, back pain, joint swelling, arthralgias, gait problem, neck pain and neck stiffness.  Skin: Negative for rash.  Neurological: Negative for dizziness, tremors, syncope, facial asymmetry,  speech difficulty, weakness, numbness and headaches.  Psychiatric/Behavioral: Negative for behavioral problems, confusion and agitation.      Allergies  Codeine and Penicillins  Home Medications   Prior to Admission medications   Medication Sig Start Date End Date Taking? Authorizing Provider  albuterol (PROVENTIL HFA;VENTOLIN HFA) 108 (90 BASE) MCG/ACT inhaler Inhale 1-2 puffs into the lungs every 6 (six) hours as needed for wheezing. 12/11/12  Yes Nelva Nay, MD  ibuprofen (ADVIL,MOTRIN) 600 MG tablet Take 1 tablet (600 mg total) by mouth every 6 (six) hours as needed. Patient taking differently: Take 600 mg by mouth every 6 (six) hours as needed for moderate pain.  05/10/15  Yes Caleb G Melancon, MD   BP 134/103 mmHg  Pulse 73  Resp 19  SpO2 98% Physical Exam  Constitutional: She is oriented to person, place, and time. She appears well-developed and well-nourished. No distress.  HENT:  Head: Normocephalic and atraumatic.  Right Ear: External ear normal.  Left Ear: External ear normal.  Nose: Nose normal.  Mouth/Throat: Oropharynx is clear and moist. No oropharyngeal exudate.  Eyes: Conjunctivae and EOM are normal. Pupils are equal, round, and reactive to light. Right eye exhibits no discharge. Left eye exhibits no discharge.  Neck: Normal range of motion. Neck supple.  Cardiovascular: Normal rate, regular rhythm, normal heart sounds and intact distal pulses.  Exam reveals no gallop and no friction rub.   No murmur heard. Pulmonary/Chest: Effort normal and breath sounds normal. No respiratory distress. She has no wheezes. She has no rales. She exhibits  tenderness (Mild TTP over the left chest, does not reproduce the same pain that she presented with).  Abdominal: Soft. Bowel sounds are normal. She exhibits no distension and no mass. There is no tenderness. There is no rebound and no guarding.  Musculoskeletal: Normal range of motion. She exhibits no edema or tenderness.   Neurological: She is alert and oriented to person, place, and time. She exhibits normal muscle tone.  Skin: Skin is warm and dry. No rash noted. She is not diaphoretic.  Psychiatric: She has a normal mood and affect. Her behavior is normal. Judgment and thought content normal.    ED Course  Procedures (including critical care time) Labs Review Labs Reviewed  BASIC METABOLIC PANEL - Abnormal; Notable for the following:    Glucose, Bld 106 (*)    All other components within normal limits  URINALYSIS, ROUTINE W REFLEX MICROSCOPIC (NOT AT [facility]) - Abnormal; Notable for the following:    Leukocytes, UA MODERATE (*)    All other components within normal limits  URINE MICROSCOPIC-ADD ON - Abnormal; Notable for the following:    Squamous Epithelial / LPF 0-5 (*)    Bacteria, UA FEW (*)    All other components within normal limits  CBC  D-DIMER, QUANTITATIVE (NOT AT [facility])  Rosezena SensorI-STAT TROPOININ, ED  Rosezena SensorI-STAT TROPOININ, ED    Imaging Review Dg Chest 2 View  07/13/2015  CLINICAL DATA:  Chest pain EXAM: CHEST  2 VIEW COMPARISON:  12/11/2012 chest radiograph. FINDINGS: Stable cardiomediastinal silhouette with normal heart size. No pneumothorax. No pleural effusion. Lungs appear clear, with no acute consolidative airspace disease and no pulmonary edema. Cholecystectomy clips are seen in the right upper quadrant of the abdomen. IMPRESSION: No active cardiopulmonary disease. Electronically Signed   By: Delbert PhenixJason A Poff M.D.   On: 07/13/2015 21:06   I have personally reviewed and evaluated these images and lab results as part of my medical decision-making.   EKG Interpretation None      MDM   Final diagnoses:  Chest pain, unspecified chest pain type   Patient is generally well-appearing. Vital signs stable. EKG with flipped T waves in leads 3 and V1 however this is unchanged from prior. D-dimer is not elevated and I do not suspect PE at this time. Chest x-ray with no evidence of pneumonia. Delta  troponin 0.00, HEART score of 3. Pain is partially reproduced with palpation. Due to the atypical nature of chest pain, doubt underlying cardiac etiology, though recommend outpatient follow-up with her PCP for outpatient stress test. Patient expressed agreement and understanding with this plan. She is given strict precautions to return to the ED for worsening pain, shortness of breath, exertional component. She was discharged in good condition.    Jenifer Ernestina PennaBrunno Irick, MD 07/14/15 [id number]  Margarita Grizzleanielle Ray, MD 07/14/15 2114

## 2015-07-13 NOTE — ED Notes (Signed)
Pt here for left sided chest pain that began PTA.  Pt is a smoker, pain began after having a cigarette.  Pain increases with coughing and even light palpation of her chest.  No sob, or other associated symptoms.

## 2015-07-14 DIAGNOSIS — R079 Chest pain, unspecified: Secondary | ICD-10-CM | POA: Diagnosis not present

## 2015-07-14 LAB — I-STAT TROPONIN, ED: Troponin i, poc: 0 ng/mL (ref 0.00–0.08)

## 2015-12-16 ENCOUNTER — Emergency Department (HOSPITAL_COMMUNITY): Payer: Medicare Other

## 2015-12-16 ENCOUNTER — Emergency Department (HOSPITAL_COMMUNITY)
Admission: EM | Admit: 2015-12-16 | Discharge: 2015-12-16 | Disposition: A | Discharge status: Home / Self Care | Payer: Medicare Other | Attending: Emergency Medicine | Admitting: Emergency Medicine

## 2015-12-16 ENCOUNTER — Encounter (HOSPITAL_COMMUNITY): Payer: Self-pay

## 2015-12-16 DIAGNOSIS — R079 Chest pain, unspecified: Secondary | ICD-10-CM | POA: Diagnosis present

## 2015-12-16 DIAGNOSIS — R0789 Other chest pain: Secondary | ICD-10-CM | POA: Diagnosis not present

## 2015-12-16 DIAGNOSIS — F17210 Nicotine dependence, cigarettes, uncomplicated: Secondary | ICD-10-CM | POA: Diagnosis not present

## 2015-12-16 LAB — CBC
HEMATOCRIT: 45.8 % (ref 36.0–46.0)
HEMOGLOBIN: 15.2 g/dL — AB (ref 12.0–15.0)
MCH: 29.9 pg (ref 26.0–34.0)
MCHC: 33.2 g/dL (ref 30.0–36.0)
MCV: 90 fL (ref 78.0–100.0)
Platelets: 216 10*3/uL (ref 150–400)
RBC: 5.09 MIL/uL (ref 3.87–5.11)
RDW: 14.3 % (ref 11.5–15.5)
WBC: 9.6 10*3/uL (ref 4.0–10.5)

## 2015-12-16 LAB — I-STAT TROPONIN, ED
Troponin i, poc: 0 ng/mL (ref 0.00–0.08)
Troponin i, poc: 0 ng/mL (ref 0.00–0.08)

## 2015-12-16 LAB — BASIC METABOLIC PANEL
ANION GAP: 8 (ref 5–15)
BUN: 9 mg/dL (ref 6–20)
CHLORIDE: 107 mmol/L (ref 101–111)
CO2: 23 mmol/L (ref 22–32)
Calcium: 9.6 mg/dL (ref 8.9–10.3)
Creatinine, Ser: 0.86 mg/dL (ref 0.44–1.00)
GFR calc Af Amer: >60 mL/min (ref >60)
GLUCOSE: 113 mg/dL — AB (ref 65–99)
POTASSIUM: 3.4 mmol/L — AB (ref 3.5–5.1)
Sodium: 138 mmol/L (ref 135–145)

## 2015-12-16 LAB — D-DIMER, QUANTITATIVE (NOT AT ARMC): D DIMER QUANT: 0.57 ug{FEU}/mL — AB (ref 0.00–0.50)

## 2015-12-16 MED ORDER — ACETAMINOPHEN 325 MG PO TABS
650.0000 mg | ORAL_TABLET | Freq: Once | ORAL | Status: AC
  Administered 2015-12-16: 650 mg via ORAL
  Filled 2015-12-16: qty 2

## 2015-12-16 MED ORDER — IBUPROFEN 400 MG PO TABS
400.0000 mg | ORAL_TABLET | Freq: Once | ORAL | Status: AC
  Administered 2015-12-16: 400 mg via ORAL
  Filled 2015-12-16: qty 1

## 2015-12-16 NOTE — ED Triage Notes (Signed)
Patient here with left anterior chest/shoulder pain x 1 hour. States that the pain feels like a cramp. No other associated symptoms. Reports that she developed cough last pm, NAD on arrival

## 2015-12-16 NOTE — Discharge Instructions (Signed)
Please read and follow all provided instructions.  Your diagnoses today include:  1. Chest pain, unspecified chest pain type    Tests performed today include: An EKG of your heart A chest x-ray Cardiac enzymes - a blood test for heart muscle damage Blood counts and electrolytes Vital signs. See below for your results today.   Medications prescribed:   Take any prescribed medications only as directed.  Follow-up instructions: Please follow-up with your primary care provider as soon as you can for further evaluation of your symptoms.   Return instructions:  SEEK IMMEDIATE MEDICAL ATTENTION IF: You have severe chest pain, especially if the pain is crushing or pressure-like and spreads to the arms, back, neck, or jaw, or if you have sweating, nausea (feeling sick to your stomach), or shortness of breath. THIS IS AN EMERGENCY. Don't wait to see if the pain will go away. Get medical help at once. Call 911 or 0 (operator). DO NOT drive yourself to the hospital.  Your chest pain gets worse and does not go away with rest.  You have an attack of chest pain lasting longer than usual, despite rest and treatment with the medications your caregiver has prescribed.  You wake from sleep with chest pain or shortness of breath. You feel dizzy or faint. You have chest pain not typical of your usual pain for which you originally saw your caregiver.  You have any other emergent concerns regarding your health.  Additional Information: Chest pain comes from many different causes. Your caregiver has diagnosed you as having chest pain that is not specific for one problem, but does not require admission.  You are at low risk for an acute heart condition or other serious illness.   Your vital signs today were: BP 115/88    Pulse 63    Temp 98.9 F (37.2 C) (Oral)    Resp 17    SpO2 100%  If your blood pressure (BP) was elevated above 135/85 this visit, please have this repeated by your doctor within one  month. --------------

## 2015-12-16 NOTE — ED Provider Notes (Signed)
[facility]-EMERGENCY DEPT Provider Note   CSN: [id number] Arrival date & time: 12/16/15  1851     History   Chief Complaint Chief Complaint  Patient presents with  . Chest Pain    HPI [name] is a 65 y.o. female.  HPI  64 y.o. female, presents to the Emergency Department today complaining of left sided chest pain x 2 hours ago. States that the pain occurred while resting and watching TV. States that the pain was initially sharp in nature and lasted for 3 minutes. Now it is a persist ant dull ache that comes and goes. Notes pain 6/10. Is not worse with movement of air. Notes hx similar in March. No fevers at home. No diaphoresis, N/V with onset of pain. Does endorse numbness down left arm occasionally. No hx MI/ACS. No hx DVT/PE. No recent surgeries. No recent travel. No other symptoms noted.   Past Medical History:  Diagnosis Date  . Arthritis     Patient Active Problem List   Diagnosis Date Noted  . Left shoulder pain 01/11/2015    Past Surgical History:  Procedure Laterality Date  . HAND SURGERY Left   . TOTAL KNEE ARTHROPLASTY      OB History    No data available       Home Medications    Prior to Admission medications   Medication Sig Start Date End Date Taking? Authorizing Provider  albuterol (PROVENTIL HFA;VENTOLIN HFA) 108 (90 BASE) MCG/ACT inhaler Inhale 1-2 puffs into the lungs every 6 (six) hours as needed for wheezing. 12/11/12   Nelva Nay, MD  ibuprofen (ADVIL,MOTRIN) 600 MG tablet Take 1 tablet (600 mg total) by mouth every 6 (six) hours as needed. Patient taking differently: Take 600 mg by mouth every 6 (six) hours as needed for moderate pain.  05/10/15   Yolande Jolly, MD    Family History No family history on file.  Social History Social History  Substance Use Topics  . Smoking status: Current Every Day Smoker    Packs/day: 2.00    Types: Cigarettes  . Smokeless tobacco: Never Used  . Alcohol use No     Allergies   Codeine and  Penicillins   Review of Systems Review of Systems ROS reviewed and all are negative for acute change except as noted in the HPI.  Physical Exam Updated Vital Signs BP 134/81 (BP Location: Right Arm)   Pulse 88   Temp 98.9 F (37.2 C) (Oral)   Resp 18   SpO2 100%   Physical Exam  Constitutional: She is oriented to person, place, and time. Vital signs are normal. She appears well-developed and well-nourished.  HENT:  Head: Normocephalic.  Right Ear: Hearing normal.  Left Ear: Hearing normal.  Eyes: Conjunctivae and EOM are normal. Pupils are equal, round, and reactive to light.  Neck: Normal range of motion. Neck supple.  Cardiovascular: Normal rate, regular rhythm, normal heart sounds and intact distal pulses.   Pulmonary/Chest: Effort normal and breath sounds normal. She exhibits tenderness.  Left axillary tenderness on palpation of chest wall  Abdominal: Soft.  Neurological: She is alert and oriented to person, place, and time.  Skin: Skin is warm and dry.  Psychiatric: She has a normal mood and affect. Her speech is normal and behavior is normal. Thought content normal.   ED Treatments / Results  Labs (all labs ordered are listed, but only abnormal results are displayed) Labs Reviewed  BASIC METABOLIC PANEL - Abnormal; Notable for the following:  Result Value   Potassium 3.4 (*)    Glucose, Bld 113 (*)    All other components within normal limits  CBC - Abnormal; Notable for the following:    Hemoglobin 15.2 (*)    All other components within normal limits  D-DIMER, QUANTITATIVE (NOT AT [facility]) - Abnormal; Notable for the following:    D-Dimer, Quant 0.57 (*)    All other components within normal limits  I-STAT TROPOININ, ED  I-STAT TROPOININ, ED   EKG  EKG Interpretation  Date/Time:  Saturday December 16 2015 18:56:01 EDT Ventricular Rate:  90 PR Interval:  170 QRS Duration: 70 QT Interval:  342 QTC Calculation: 418 R Axis:   27 Text Interpretation:   Normal sinus rhythm Possible Inferior infarct , age undetermined Cannot rule out Anterior infarct , age undetermined Abnormal ECG No significant change since last tracing Confirmed by [facility] MD, PEDRO [id number]) on 12/16/2015 10:29:25 PM      Radiology Dg Chest 2 View  Result Date: 12/16/2015 CLINICAL DATA:  Left anterior chest and shoulder pain for 1 hour. Cough starting last night. EXAM: CHEST  2 VIEW COMPARISON:  07/13/2015 FINDINGS: Normal heart size and pulmonary vascularity. No focal airspace disease or consolidation in the lungs. No blunting of costophrenic angles. No pneumothorax. Mediastinal contours appear intact. Calcification of the aorta. Surgical clips in the right upper quadrant. IMPRESSION: No active cardiopulmonary disease. Electronically Signed   By: Burman Nieves M.D.   On: 12/16/2015 19:55    Procedures Procedures (including critical care time)  Medications Ordered in ED Medications - No data to display   Initial Impression / Assessment and Plan / ED Course  I have reviewed the triage vital signs and the nursing notes.  Pertinent labs & imaging results that were available during my care of the patient were reviewed by me and considered in my medical decision making (see chart for details).  Clinical Course    Final Clinical Impressions(s) / ED Diagnoses  I have reviewed and evaluated the relevant laboratory valuesI have reviewed and evaluated the relevant imaging studies. I have interpreted the relevant EKG.I have reviewed the relevant previous healthcare records.I have reviewed EMS Documentation.I obtained HPI from historian. Patient discussed with supervising physician  ED Course:  Assessment: Pt is a 65yF presents with CP x 2 hours ago. No NV. No diaphoresis. Pain occurred at rest. Duration <3 min. Risk Factors smoking, obesity. Patient is to be discharged with recommendation to follow up with PCP in regards to today's hospital visit. Chest pain is not likely of  cardiac or pulmonary etiology d/t presentation, perc negative, VSS, no tracheal deviation, no JVD or new murmur, RRR, breath sounds equal bilaterally, EKG without acute abnormalities, negative troponin x2, and negative CXR. D Dimer 0/57, but with adjusted for age, it is negative. Heart Score 3. Likely musculoskeletal in nature as pain is point tender on left axillary region. Pt has been advised start a NSAIDs and return to the ED is CP becomes exertional, associated with diaphoresis or nausea, radiates to left jaw/arm, worsens or becomes concerning in any way. Pt appears reliable for follow up and is agreeable to discharge. Patient is in no acute distress. Vital Signs are stable. Patient is able to ambulate. Patient able to tolerate PO.    Disposition/Plan:  DC Home Additional Verbal discharge instructions given and discussed with patient.  Pt Instructed to f/u with PCP in the next week for evaluation and treatment of symptoms. Return precautions given Pt acknowledges and  agrees with plan  Supervising Physician Nira ConnPedro Eduardo Cardama, MD   Final diagnoses:  Chest pain, unspecified chest pain type    New Prescriptions New Prescriptions   No medications on file     Audry Piliyler Jarion Hawthorne, PA-C 12/16/15 2237    Nira ConnPedro Eduardo Cardama, MD 12/17/15 [phone]

## 2015-12-16 NOTE — ED Notes (Signed)
Pt left at this time with all belongings.  

## 2016-05-26 ENCOUNTER — Encounter (HOSPITAL_COMMUNITY): Payer: Self-pay | Admitting: Emergency Medicine

## 2016-05-26 ENCOUNTER — Emergency Department (HOSPITAL_COMMUNITY)
Admission: EM | Admit: 2016-05-26 | Discharge: 2016-05-26 | Disposition: A | Discharge status: Home / Self Care | Payer: Medicare Other | Attending: Emergency Medicine | Admitting: Emergency Medicine

## 2016-05-26 ENCOUNTER — Emergency Department (HOSPITAL_COMMUNITY): Payer: Medicare Other

## 2016-05-26 DIAGNOSIS — Z23 Encounter for immunization: Secondary | ICD-10-CM | POA: Insufficient documentation

## 2016-05-26 DIAGNOSIS — Z79899 Other long term (current) drug therapy: Secondary | ICD-10-CM | POA: Insufficient documentation

## 2016-05-26 DIAGNOSIS — F17210 Nicotine dependence, cigarettes, uncomplicated: Secondary | ICD-10-CM | POA: Insufficient documentation

## 2016-05-26 DIAGNOSIS — M79641 Pain in right hand: Secondary | ICD-10-CM | POA: Diagnosis present

## 2016-05-26 LAB — CBC WITH DIFFERENTIAL/PLATELET
Basophils Absolute: 0 10*3/uL (ref 0.0–0.1)
Basophils Relative: 0 %
EOS PCT: 2 %
Eosinophils Absolute: 0.2 10*3/uL (ref 0.0–0.7)
HEMATOCRIT: 46.1 % — AB (ref 36.0–46.0)
HEMOGLOBIN: 15.4 g/dL — AB (ref 12.0–15.0)
LYMPHS ABS: 3.2 10*3/uL (ref 0.7–4.0)
LYMPHS PCT: 31 %
MCH: 29.8 pg (ref 26.0–34.0)
MCHC: 33.4 g/dL (ref 30.0–36.0)
MCV: 89.3 fL (ref 78.0–100.0)
Monocytes Absolute: 0.7 10*3/uL (ref 0.1–1.0)
Monocytes Relative: 7 %
NEUTROS ABS: 6.3 10*3/uL (ref 1.7–7.7)
Neutrophils Relative %: 60 %
Platelets: 228 10*3/uL (ref 150–400)
RBC: 5.16 MIL/uL — AB (ref 3.87–5.11)
RDW: 14.3 % (ref 11.5–15.5)
WBC: 10.4 10*3/uL (ref 4.0–10.5)

## 2016-05-26 LAB — COMPREHENSIVE METABOLIC PANEL
ALBUMIN: 3.6 g/dL (ref 3.5–5.0)
ALT: 12 U/L — ABNORMAL LOW (ref 14–54)
ANION GAP: 9 (ref 5–15)
AST: 16 U/L (ref 15–41)
Alkaline Phosphatase: 89 U/L (ref 38–126)
BILIRUBIN TOTAL: 0.6 mg/dL (ref 0.3–1.2)
BUN: 8 mg/dL (ref 6–20)
CHLORIDE: 107 mmol/L (ref 101–111)
CO2: 24 mmol/L (ref 22–32)
Calcium: 9.5 mg/dL (ref 8.9–10.3)
Creatinine, Ser: 0.74 mg/dL (ref 0.44–1.00)
GFR calc Af Amer: >60 mL/min (ref >60)
Glucose, Bld: 122 mg/dL — ABNORMAL HIGH (ref 65–99)
POTASSIUM: 3.9 mmol/L (ref 3.5–5.1)
Sodium: 140 mmol/L (ref 135–145)
TOTAL PROTEIN: 7.2 g/dL (ref 6.5–8.1)

## 2016-05-26 MED ORDER — OXYCODONE-ACETAMINOPHEN 5-325 MG PO TABS
2.0000 | ORAL_TABLET | Freq: Once | ORAL | Status: AC
  Administered 2016-05-26: 2 via ORAL
  Filled 2016-05-26: qty 2

## 2016-05-26 MED ORDER — ACETAMINOPHEN 325 MG PO TABS
325.0000 mg | ORAL_TABLET | Freq: Once | ORAL | Status: AC
  Administered 2016-05-26: 325 mg via ORAL
  Filled 2016-05-26: qty 1

## 2016-05-26 MED ORDER — TETANUS-DIPHTH-ACELL PERTUSSIS 5-2.5-18.5 LF-MCG/0.5 IM SUSP
0.5000 mL | Freq: Once | INTRAMUSCULAR | Status: AC
  Administered 2016-05-26: 0.5 mL via INTRAMUSCULAR
  Filled 2016-05-26: qty 0.5

## 2016-05-26 MED ORDER — CLINDAMYCIN HCL 300 MG PO CAPS: 300.0000 mg | ORAL_CAPSULE | Freq: Four times a day (QID) | ORAL | 0 refills | Status: DC

## 2016-05-26 MED ORDER — CLINDAMYCIN HCL 150 MG PO CAPS
300.0000 mg | ORAL_CAPSULE | Freq: Once | ORAL | Status: AC
  Administered 2016-05-26: 300 mg via ORAL
  Filled 2016-05-26: qty 2

## 2016-05-26 NOTE — ED Triage Notes (Signed)
Pt c/o right hand redness, pain and swelling onset last night. Pt tried ibuprofen without relief.

## 2016-05-26 NOTE — ED Provider Notes (Signed)
[facility]EM[facility]PT Provider Note   CSN: [id number] Arrival date & time: 05/26/16  1457     History   Chief Complaint Chief Complaint  Patient presents with  . Hand Pain    HPI [name] is a 66 y.o. female.  HPI Pt c/o right hand redness, pain and swelling onset last night. Pt tried ibuprofen without relief. States yesterday she was moving some wood for a fire and thinks maybe a bug bit her but she is not sure. After this, she started noticing some pain in the knuckle area progressed. She started having some chills but did not check her temperature This morning, she noted her pain to be significantly worsen her whole hand was swollen Ibuprofen is not helping Pain is severe No drainage or wound noted Patient is not up-to-date with tetanus  Past Medical History:  Diagnosis Date  . Arthritis     Patient Active Problem List   Diagnosis Date Noted  . Left shoulder pain 01/11/2015    Past Surgical History:  Procedure Laterality Date  . HAND SURGERY Left   . TOTAL KNEE ARTHROPLASTY      OB History    No data available       Home Medications    Prior to Admission medications   Medication Sig Start Date End Date Taking? Authorizing Provider  albuterol (PROVENTIL HFA;VENTOLIN HFA) 108 (90 BASE) MCG/ACT inhaler Inhale 1-2 puffs into the lungs every 6 (six) hours as needed for wheezing. 12/11/12   Nelva Nay, MD  clindamycin (CLEOCIN) 300 MG capsule Take 1 capsule (300 mg total) by mouth every 6 (six) hours. 05/26/16   Sidney Ace, MD  ibuprofen (ADVIL,MOTRIN) 600 MG tablet Take 1 tablet (600 mg total) by mouth every 6 (six) hours as needed. Patient taking differently: Take 600 mg by mouth every 6 (six) hours as needed for moderate pain.  05/10/15   Yolande Jolly, MD    Family History No family history on file.  Social History Social History  Substance Use Topics  . Smoking status: Current Every Day Smoker    Packs/day: 2.00    Types:  Cigarettes  . Smokeless tobacco: Never Used  . Alcohol use No     Allergies   Codeine and Penicillins   Review of Systems Review of Systems  Constitutional: Positive for fever.  Allergic/Immunologic: Negative for immunocompromised state.  All other systems reviewed and are negative.    Physical Exam Updated Vital Signs BP 151/80 (BP Location: Left Arm)   Pulse 64   Temp 98.4 F (36.9 C) (Oral)   Resp 18   Ht 5' 5.5" (1.664 m)   Wt 90.3 kg   SpO2 98%   BMI 32.61 kg/m   Physical Exam  Constitutional: She is oriented to person, place, and time. She appears well-developed and well-nourished. No distress.  HENT:  Head: Normocephalic and atraumatic.  Eyes: Conjunctivae are normal. Right eye exhibits no discharge. Left eye exhibits no discharge.  Neck: Normal range of motion. Neck supple.  Cardiovascular: Normal rate and regular rhythm.   Pulmonary/Chest: Effort normal and breath sounds normal. No respiratory distress.  Abdominal: Soft. Bowel sounds are normal. She exhibits no distension and no mass. There is no tenderness. There is no rebound and no guarding.  Neg cvat Neg murphys sign  Musculoskeletal: She exhibits edema.  Right dorsum of hand has significant swelling above the knuckles of the second third and fourth digits. There is significant tenderness and patient is holding her  digits slightly flexed but is able to range the digits; she has no tenderness in the flexor sheath. Does have superficial abrasions to dorsum of hand that are not tender, non draining. Full ROM of wrist. <2 sec cap refill.   Neurological: She is alert and oriented to person, place, and time.  Skin: Skin is warm. No rash noted.  Psychiatric: She has a normal mood and affect.  Nursing note and vitals reviewed.  ED Treatments / Results  Labs (all labs ordered are listed, but only abnormal results are displayed) Labs Reviewed  COMPREHENSIVE METABOLIC PANEL - Abnormal; Notable for the following:        Result Value   Glucose, Bld 122 (*)    ALT 12 (*)    All other components within normal limits  CBC WITH DIFFERENTIAL/PLATELET - Abnormal; Notable for the following:    RBC 5.16 (*)    Hemoglobin 15.4 (*)    HCT 46.1 (*)    All other components within normal limits    EKG  EKG Interpretation None       Radiology Dg Hand 2 View Right  Result Date: 05/26/2016 CLINICAL DATA:  Right hand redness, pain and swelling beginning last night. EXAM: RIGHT HAND - 2 VIEW COMPARISON:  10/05/2014 FINDINGS: Chronic flexion deformity of the PIP joint of the small finger. 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 No sign of acute fracture or injury. Widening of the scapholunate distance likely indicate ligament tear. IMPRESSION: No acute finding. 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 Electronically Signed   By: Paulina FusiMark  Shogry M.D.   On: 05/26/2016 19:45    Procedures Procedures (including critical care time)  Medications Ordered in ED Medications  oxyCODONE-acetaminophen (PERCOCET/ROXICET) 5-325 MG per tablet 2 tablet (2 tablets Oral Given 05/26/16 1849)  acetaminophen (TYLENOL) tablet 325 mg (325 mg Oral Given 05/26/16 1901)  Tdap (BOOSTRIX) injection 0.5 mL (0.5 mLs Intramuscular Given 05/26/16 1957)  clindamycin (CLEOCIN) capsule 300 mg (300 mg Oral Given 05/26/16 1957)     Initial Impression / Assessment and Plan / ED Course  I have reviewed the triage vital signs and the nursing notes.  Pertinent labs & imaging results that were available during my care of the patient were reviewed by me and considered in my medical decision making (see chart for details).     X-ray without gas, effusion. Full range of motion of the wrist, doubt septic joint. Symptoms most consistent with cellulitis without abscess. Also possible the patient is having an inflammatory reaction to insect  bite. Labs reviewed and unremarkable. Clindamycin oral given and patient will be discharged with regimen. Strict return precautions given and patient verbalized understanding and agreement with plan. Tdap Updated. Patient is safe for outpatient monitoring and treatment.  Final Clinical Impressions(s) / ED Diagnoses   Final diagnoses:  Hand pain, right    New Prescriptions Discharge Medication List as of 05/26/2016  8:40 PM    START taking these medications   Details  clindamycin (CLEOCIN) 300 MG capsule Take 1 capsule (300 mg total) by mouth every 6 (six) hours., Starting Sun 05/26/2016, Print         Sidney AceAlison Charruf Faren Blyth, MD 05/27/16 [id number]    Rolan BuccoMelanie Belfi, MD 05/27/16 [phone]

## 2017-01-11 ENCOUNTER — Encounter (HOSPITAL_COMMUNITY): Payer: Self-pay | Admitting: Emergency Medicine

## 2017-01-11 ENCOUNTER — Emergency Department (HOSPITAL_COMMUNITY)
Admission: EM | Admit: 2017-01-11 | Discharge: 2017-01-11 | Disposition: A | Discharge status: Home / Self Care | Payer: Medicare Other | Attending: Emergency Medicine | Admitting: Emergency Medicine

## 2017-01-11 ENCOUNTER — Emergency Department (HOSPITAL_COMMUNITY): Payer: Medicare Other

## 2017-01-11 DIAGNOSIS — Z79899 Other long term (current) drug therapy: Secondary | ICD-10-CM | POA: Diagnosis not present

## 2017-01-11 DIAGNOSIS — F17210 Nicotine dependence, cigarettes, uncomplicated: Secondary | ICD-10-CM | POA: Insufficient documentation

## 2017-01-11 DIAGNOSIS — Z96653 Presence of artificial knee joint, bilateral: Secondary | ICD-10-CM | POA: Diagnosis not present

## 2017-01-11 DIAGNOSIS — M25562 Pain in left knee: Secondary | ICD-10-CM | POA: Diagnosis not present

## 2017-01-11 MED ORDER — TRAMADOL HCL 50 MG PO TABS: 50.0000 mg | ORAL_TABLET | Freq: Four times a day (QID) | ORAL | 0 refills | Status: DC | PRN

## 2017-01-11 NOTE — ED Provider Notes (Signed)
[facility]-EMERGENCY DEPT Provider Note   CSN: [id number] Arrival date & time: 01/11/17  1421     History   Chief Complaint Chief Complaint  Patient presents with  . Fall  . Knee Injury    HPI [name] is a 66 y.o. female.  Past medical history of bilateral total knee replacements who presents emergency Department with chief complaint of right knee pain. Patient states that she was at her daughter's house and slipped on some water. She fell forward onto her right knee and then onto her right bottom. She complains of pain in her right knee and some pain at her right trochanter. She is ambulatory. She denies any numbness or tingling. She took an over-the-counter Aleve tablet which decreased her pain but did not take it away.   HPI  Past Medical History:  Diagnosis Date  . Arthritis     Patient Active Problem List   Diagnosis Date Noted  . Left shoulder pain 01/11/2015    Past Surgical History:  Procedure Laterality Date  . HAND SURGERY Left   . TOTAL KNEE ARTHROPLASTY      OB History    No data available       Home Medications    Prior to Admission medications   Medication Sig Start Date End Date Taking? Authorizing Provider  albuterol (PROVENTIL HFA;VENTOLIN HFA) 108 (90 BASE) MCG/ACT inhaler Inhale 1-2 puffs into the lungs every 6 (six) hours as needed for wheezing. 12/11/12   Nelva Nay, MD  clindamycin (CLEOCIN) 300 MG capsule Take 1 capsule (300 mg total) by mouth every 6 (six) hours. 05/26/16   Sidney Ace, MD  ibuprofen (ADVIL,MOTRIN) 600 MG tablet Take 1 tablet (600 mg total) by mouth every 6 (six) hours as needed. Patient taking differently: Take 600 mg by mouth every 6 (six) hours as needed for moderate pain.  05/10/15   Melancon, Hillery Hunter, MD    Family History No family history on file.  Social History Social History  Substance Use Topics  . Smoking status: Current Every Day Smoker    Packs/day: 2.00    Types: Cigarettes  . Smokeless  tobacco: Never Used  . Alcohol use No     Allergies   Codeine and Penicillins   Review of Systems Review of Systems  Constitutional: Negative for chills and fever.  Musculoskeletal: Negative for gait problem.       Joint pain     Physical Exam Updated Vital Signs BP 132/78 (BP Location: Right Arm)   Pulse 70   Temp 98.6 F (37 C) (Oral)   Resp 17   Ht 5' 5.5" (1.664 m)   SpO2 98%   Physical Exam  Constitutional: She is oriented to person, place, and time. She appears well-developed and well-nourished. No distress.  HENT:  Head: Normocephalic and atraumatic.  Eyes: Conjunctivae are normal. No scleral icterus.  Neck: Normal range of motion.  Cardiovascular: Normal rate, regular rhythm and normal heart sounds.  Exam reveals no gallop and no friction rub.   No murmur heard. Pulmonary/Chest: Effort normal and breath sounds normal. No respiratory distress.  Abdominal: Soft. Bowel sounds are normal. She exhibits no distension and no mass. There is no tenderness. There is no guarding.  Musculoskeletal:  Full range of motion of the right knee, no palpable swelling. Tenderness over the lateral trochanter, no pain with flexion and extension internal or external rotation of the hip, no bruising or palpable swelling. No tailbone tenderness, no back pain.  Neurological:  She is alert and oriented to person, place, and time.  Skin: Skin is warm and dry. She is not diaphoretic.  Psychiatric: Her behavior is normal.  Nursing note and vitals reviewed.    ED Treatments / Results  Labs (all labs ordered are listed, but only abnormal results are displayed) Labs Reviewed - No data to display  EKG  EKG Interpretation None       Radiology Dg Knee Complete 4 Views Right  Result Date: 01/11/2017 CLINICAL DATA:  Acute right knee pain following fall today. Initial encounter. EXAM: RIGHT KNEE - COMPLETE 4+ VIEW COMPARISON:  03/23/2015 and prior radiographs FINDINGS: Right total knee  arthroplasty noted. No acute fracture, subluxation or dislocation noted. No complicating hardware features noted. IMPRESSION: Right total knee arthroplasty without acute abnormality or complicating features. Electronically Signed   By: Harmon Pier M.D.   On: 01/11/2017 15:34    Procedures Procedures (including critical care time)  Medications Ordered in ED Medications - No data to display   Initial Impression / Assessment and Plan / ED Course  I have reviewed the triage vital signs and the nursing notes.  Pertinent labs & imaging results that were available during my care of the patient were reviewed by me and considered in my medical decision making (see chart for details).     Patient X-Ray negative for obvious fracture or dislocation. Pain managed in ED. Pt advised to follow up with orthopedics if symptoms persist for possibility of missed fracture diagnosis. Patient given brace while in ED, conservative therapy recommended and discussed. Patient will be dc home & is agreeable with above plan.   Final Clinical Impressions(s) / ED Diagnoses   Final diagnoses:  Acute pain of left knee    New Prescriptions New Prescriptions   No medications on file     Arthor Captain, PA-C 01/11/17 1718    Cardama, Amadeo Garnet, MD 01/12/17 [phone]

## 2017-01-11 NOTE — ED Triage Notes (Signed)
Pt reports taking 2 aleve tabs 3 times a day  with out relief of pain.

## 2017-01-11 NOTE — ED Notes (Signed)
Declined W/C at D/C and was escorted to lobby by RN. 

## 2017-01-11 NOTE — ED Triage Notes (Signed)
Pt. Stated, I slipped on water and hurt my rt. Knee and I have artificial knee caps and I want to make sure they are ok.

## 2017-01-11 NOTE — Discharge Instructions (Signed)
Contact a health care provider if: °Your knee pain continues, changes, or gets worse. °You have a fever along with knee pain. °Your knee buckles or locks up. °Your knee swells, and the swelling becomes worse. °Get help right away if: °Your knee feels warm to the touch. °You cannot move your knee. °You have severe pain in your knee. °You have chest pain. °You have trouble breathing. °

## 2017-03-11 ENCOUNTER — Other Ambulatory Visit: Payer: Self-pay

## 2017-03-11 ENCOUNTER — Emergency Department (HOSPITAL_COMMUNITY)
Admission: EM | Admit: 2017-03-11 | Discharge: 2017-03-11 | Disposition: A | Discharge status: Home / Self Care | Payer: Medicare Other | Attending: Emergency Medicine | Admitting: Emergency Medicine

## 2017-03-11 ENCOUNTER — Encounter (HOSPITAL_COMMUNITY): Payer: Self-pay

## 2017-03-11 ENCOUNTER — Emergency Department (HOSPITAL_COMMUNITY): Payer: Medicare Other

## 2017-03-11 DIAGNOSIS — M79642 Pain in left hand: Secondary | ICD-10-CM | POA: Insufficient documentation

## 2017-03-11 DIAGNOSIS — F17210 Nicotine dependence, cigarettes, uncomplicated: Secondary | ICD-10-CM | POA: Insufficient documentation

## 2017-03-11 DIAGNOSIS — R2232 Localized swelling, mass and lump, left upper limb: Secondary | ICD-10-CM | POA: Diagnosis present

## 2017-03-11 MED ORDER — OXYCODONE-ACETAMINOPHEN 5-325 MG PO TABS
1.0000 | ORAL_TABLET | Freq: Once | ORAL | Status: AC
  Administered 2017-03-11: 1 via ORAL
  Filled 2017-03-11: qty 1

## 2017-03-11 MED ORDER — DICLOFENAC SODIUM 50 MG PO TBEC: 50.0000 mg | DELAYED_RELEASE_TABLET | Freq: Two times a day (BID) | ORAL | 0 refills | Status: DC

## 2017-03-11 NOTE — Discharge Instructions (Signed)
Wear the splint for comfort, follow up with your doctor in the next 2 days for recheck. Return here as needed for worsening symptoms.

## 2017-03-11 NOTE — ED Triage Notes (Signed)
Patient states she woke this AM and had left hand swelling. Patient states the swelling has decreased since this AM.

## 2017-03-11 NOTE — ED Provider Notes (Signed)
[facility]-EMERGENCY DEPT Provider Note   CSN: [id number] Arrival date & time: 03/11/17  1435     History   Chief Complaint Chief Complaint  Patient presents with  . hand swelling    HPI [name] is a 66 y.o. female who presents to the ED with left hand pain and swelling. Patient reports she woke this morning and her left hand was swollen. She states it has gone down a lot now. 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 The pain in her hand radiates to the wrist. Patient denies fever, chills, n/v or other problems today.  HPI  Past Medical History:  Diagnosis Date  . Arthritis     Patient Active Problem List   Diagnosis Date Noted  . Left shoulder pain 01/11/2015    Past Surgical History:  Procedure Laterality Date  . HAND SURGERY Left   . TOTAL KNEE ARTHROPLASTY      OB History    No data available       Home Medications    Prior to Admission medications   Medication Sig Start Date End Date Taking? Authorizing Provider  albuterol (PROVENTIL HFA;VENTOLIN HFA) 108 (90 BASE) MCG/ACT inhaler Inhale 1-2 puffs into the lungs every 6 (six) hours as needed for wheezing. 12/11/12   Nelva NayBeaton, Robert, MD  clindamycin (CLEOCIN) 300 MG capsule Take 1 capsule (300 mg total) by mouth every 6 (six) hours. 05/26/16   Sidney Aceuch, Alison Charruf, MD  diclofenac (VOLTAREN) 50 MG EC tablet Take 1 tablet (50 mg total) by mouth 2 (two) times daily. 03/11/17   Janne NapoleonNeese, Alberto Pina M, NP  traMADol (ULTRAM) 50 MG tablet Take 1 tablet (50 mg total) by mouth every 6 (six) hours as needed. 01/11/17   Arthor CaptainHarris, Abigail, PA-C    Family History Family History  Problem Relation Age of Onset  . Cancer Mother   . Cancer Father     Social History Social History   Tobacco Use  . Smoking status: Current Every Day Smoker    Packs/day: 1.00    Types: Cigarettes  . Smokeless tobacco: Never Used  Substance Use Topics  . Alcohol use: No  . Drug use: No      Allergies   Codeine and Penicillins   Review of Systems Review of Systems  Constitutional: Negative for chills and fever.  HENT: Negative.   Eyes: Negative for visual disturbance.  Respiratory: Negative for cough.   Cardiovascular: Negative for chest pain.  Gastrointestinal: Negative for abdominal pain, nausea and vomiting.  Musculoskeletal: Positive for arthralgias.  Skin: Negative for wound.  Neurological: Negative for headaches.  Psychiatric/Behavioral: Negative for confusion.     Physical Exam Updated Vital Signs BP (!) 134/92 (BP Location: Right Arm)   Pulse 81   Temp 97.9 F (36.6 C) (Oral)   Resp 16   Ht 5' 5.5" (1.664 m)   Wt 90.3 kg (199 lb)   SpO2 100%   BMI 32.61 kg/m   Physical Exam  Constitutional: She appears well-developed and well-nourished. No distress.  HENT:  Head: Normocephalic.  Eyes: EOM are normal.  Neck: Neck supple.  Cardiovascular: Normal rate.  Pulmonary/Chest: Effort normal.  Musculoskeletal:       Left wrist: She exhibits tenderness. She exhibits normal range of motion, no swelling, no crepitus and no deformity.       Left hand: She exhibits tenderness. She exhibits normal range of motion, normal capillary refill and no laceration. Swelling: minimal. Normal sensation  noted. Normal strength noted. She exhibits no thumb/finger opposition.  Radial pulses 2+, adequate circulation.   Neurological: She is alert.  Skin: Skin is warm and dry.  Psychiatric: She has a normal mood and affect.  Nursing note and vitals reviewed.    ED Treatments / Results  Labs (all labs ordered are listed, but only abnormal results are displayed) Labs Reviewed - No data to display  EKG Radiology Dg Hand Complete Left  Result Date: 03/11/2017 CLINICAL DATA:  Central left hand pain since this morning. No injury. EXAM: LEFT HAND - COMPLETE 3+ VIEW COMPARISON:  Left hand x-rays dated March 25, 2011. FINDINGS: No acute fracture or malalignment.  Unchanged fused middle finger PIP joint. Mild degenerative changes of the IP joints. Bone mineralization is normal. Soft tissues are unremarkable. IMPRESSION: No acute osseous abnormality. Electronically Signed   By: Obie DredgeWilliam T Derry M.D.   On: 03/11/2017 17:51    Procedures Procedures (including critical care time)  Medications Ordered in ED Medications  oxyCODONE-acetaminophen (PERCOCET/ROXICET) 5-325 MG per tablet 1 tablet (1 tablet Oral Given 03/11/17 1702)     Initial Impression / Assessment and Plan / ED Course  I have reviewed the triage vital signs and the nursing notes. 66 y.o. female with left hand pain and swelling that started when she woke this am but has improved over the course of the day stable for d/c without focal neuro deficits. Splint applied, pain managed in the ED. Encouraged patient to f/u with her PCP in the next 24 to 48 hours for recheck. Return precautions discussed.   Final Clinical Impressions(s) / ED Diagnoses   Final diagnoses:  Left hand pain    ED Discharge Orders        Ordered    diclofenac (VOLTAREN) 50 MG EC tablet  2 times daily     03/11/17 [id number]       Travia Onstad, Cascade ColonyHope M, [place]NP 03/11/17 1825    Benjiman CorePickering, Nathan, MD 03/11/17 2329

## 2017-03-16 ENCOUNTER — Emergency Department (HOSPITAL_COMMUNITY): Payer: Medicare Other

## 2017-03-16 ENCOUNTER — Encounter (HOSPITAL_COMMUNITY): Payer: Self-pay | Admitting: Emergency Medicine

## 2017-03-16 ENCOUNTER — Emergency Department (HOSPITAL_COMMUNITY)
Admission: EM | Admit: 2017-03-16 | Discharge: 2017-03-16 | Disposition: A | Discharge status: Home / Self Care | Payer: Medicare Other | Attending: Emergency Medicine | Admitting: Emergency Medicine

## 2017-03-16 DIAGNOSIS — F17210 Nicotine dependence, cigarettes, uncomplicated: Secondary | ICD-10-CM | POA: Diagnosis not present

## 2017-03-16 DIAGNOSIS — Z96659 Presence of unspecified artificial knee joint: Secondary | ICD-10-CM | POA: Insufficient documentation

## 2017-03-16 DIAGNOSIS — R1032 Left lower quadrant pain: Secondary | ICD-10-CM | POA: Diagnosis not present

## 2017-03-16 LAB — CBC
HCT: 43.7 % (ref 36.0–46.0)
HEMOGLOBIN: 14.5 g/dL (ref 12.0–15.0)
MCH: 30.4 pg (ref 26.0–34.0)
MCHC: 33.2 g/dL (ref 30.0–36.0)
MCV: 91.6 fL (ref 78.0–100.0)
Platelets: 219 10*3/uL (ref 150–400)
RBC: 4.77 MIL/uL (ref 3.87–5.11)
RDW: 14.8 % (ref 11.5–15.5)
WBC: 10.9 10*3/uL — ABNORMAL HIGH (ref 4.0–10.5)

## 2017-03-16 LAB — COMPREHENSIVE METABOLIC PANEL
ALT: 12 U/L — AB (ref 14–54)
ANION GAP: 5 (ref 5–15)
AST: 15 U/L (ref 15–41)
Albumin: 3.9 g/dL (ref 3.5–5.0)
Alkaline Phosphatase: 86 U/L (ref 38–126)
BUN: 9 mg/dL (ref 6–20)
CHLORIDE: 106 mmol/L (ref 101–111)
CO2: 28 mmol/L (ref 22–32)
Calcium: 9.3 mg/dL (ref 8.9–10.3)
Creatinine, Ser: 0.71 mg/dL (ref 0.44–1.00)
GFR calc non Af Amer: >60 mL/min (ref >60)
Glucose, Bld: 121 mg/dL — ABNORMAL HIGH (ref 65–99)
Potassium: 3.9 mmol/L (ref 3.5–5.1)
SODIUM: 139 mmol/L (ref 135–145)
Total Bilirubin: 0.3 mg/dL (ref 0.3–1.2)
Total Protein: 7.5 g/dL (ref 6.5–8.1)

## 2017-03-16 LAB — URINALYSIS, ROUTINE W REFLEX MICROSCOPIC
BILIRUBIN URINE: NEGATIVE
Bacteria, UA: NONE SEEN
Glucose, UA: NEGATIVE mg/dL
HGB URINE DIPSTICK: NEGATIVE
KETONES UR: NEGATIVE mg/dL
Nitrite: NEGATIVE
Protein, ur: NEGATIVE mg/dL
RBC / HPF: NONE SEEN RBC/hpf (ref 0–5)
Specific Gravity, Urine: 1.029 (ref 1.005–1.030)
pH: 5 (ref 5.0–8.0)

## 2017-03-16 LAB — LIPASE, BLOOD: LIPASE: 40 U/L (ref 11–51)

## 2017-03-16 MED ORDER — IOPAMIDOL (ISOVUE-300) INJECTION 61%
INTRAVENOUS | Status: AC
  Administered 2017-03-16: 100 mL via INTRAVENOUS
  Filled 2017-03-16: qty 100

## 2017-03-16 MED ORDER — ALBUTEROL SULFATE HFA 108 (90 BASE) MCG/ACT IN AERS: 1.0000 | INHALATION_SPRAY | Freq: Four times a day (QID) | RESPIRATORY_TRACT | 0 refills | Status: DC | PRN

## 2017-03-16 NOTE — ED Triage Notes (Signed)
Pt reports she began to have LLQ this afternoon. No n/v/d or dysuria. Has had more gas than normal.

## 2017-03-16 NOTE — ED Provider Notes (Signed)
[facility]   CSN: [id number] Arrival date & time: 03/16/17  1656     History   Chief Complaint Chief Complaint  Patient presents with  . Abdominal Pain    HPI [name] is a 66 y.o. female.  The history is provided by the patient and medical records. No language interpreter was used.  Abdominal Pain   Associated symptoms include diarrhea.   [name] is a 66 y.o. female  with a PMH of arthritis who presents to the Emergency Department complaining of acute onset of left lower quadrant and left flank pain beginning this morning while sitting in church. Pain is constant, worse with certain movements. Denies alleviating factors. No medications or treatment prior to arrival. Denies history of similar. Does report a few loose, non-bloody stools this morning.  No chest pain, shortness of breath, nausea, vomiting, constipation, fever, chills, back pain, dysuria, urinary urgency / frequency, vaginal discharge. No sick contacts. No recent travel.   Past Medical History:  Diagnosis Date  . Arthritis     Patient Active Problem List   Diagnosis Date Noted  . Left shoulder pain 01/11/2015    Past Surgical History:  Procedure Laterality Date  . HAND SURGERY Left   . TOTAL KNEE ARTHROPLASTY      OB History    No data available       Home Medications    Prior to Admission medications   Medication Sig Start Date End Date Taking? Authorizing Provider  diclofenac (VOLTAREN) 50 MG EC tablet Take 1 tablet (50 mg total) by mouth 2 (two) times daily. 03/11/17  Yes Neese, Hope M, NP  albuterol (PROVENTIL HFA;VENTOLIN HFA) 108 (90 Base) MCG/ACT inhaler Inhale 1-2 puffs into the lungs every 6 (six) hours as needed for wheezing or shortness of breath. 03/16/17   Ward, Chase Picket, PA-C  traMADol (ULTRAM) 50 MG tablet Take 1 tablet (50 mg total) by mouth every 6 (six) hours as needed. Patient not taking: Reported on 03/16/2017  01/11/17   Arthor Captain, PA-C    Family History Family History  Problem Relation Age of Onset  . Cancer Mother   . Cancer Father     Social History Social History   Tobacco Use  . Smoking status: Current Every Day Smoker    Packs/day: 1.00    Types: Cigarettes  . Smokeless tobacco: Never Used  Substance Use Topics  . Alcohol use: No  . Drug use: No     Allergies   Codeine and Penicillins   Review of Systems Review of Systems  Gastrointestinal: Positive for abdominal pain and diarrhea.  All other systems reviewed and are negative.    Physical Exam Updated Vital Signs BP 124/66   Pulse 67   Temp 98.2 F (36.8 C) (Oral)   Resp 16   SpO2 99%   Physical Exam  Constitutional: She is oriented to person, place, and time. She appears well-developed and well-nourished. No distress.  HENT:  Head: Normocephalic and atraumatic.  Cardiovascular: Normal rate, regular rhythm and normal heart sounds.  No murmur heard. Pulmonary/Chest: Effort normal and breath sounds normal. No respiratory distress.  Abdominal: Soft. She exhibits no distension.  Tenderness to palpation of left lower quadrant and left flank. No overlying skin changes. No rebound or guarding. Hyperactive bowel sounds.  Musculoskeletal: She exhibits no edema.  Neurological: She is alert and oriented to person, place, and time.  Skin: Skin is warm and dry.  Nursing note and  vitals reviewed.    ED Treatments / Results  Labs (all labs ordered are listed, but only abnormal results are displayed) Labs Reviewed  COMPREHENSIVE METABOLIC PANEL - Abnormal; Notable for the following components:      Result Value   Glucose, Bld 121 (*)    ALT 12 (*)    All other components within normal limits  CBC - Abnormal; Notable for the following components:   WBC 10.9 (*)    All other components within normal limits  URINALYSIS, ROUTINE W REFLEX MICROSCOPIC - Abnormal; Notable for the following components:    Leukocytes, UA SMALL (*)    Squamous Epithelial / LPF 0-5 (*)    All other components within normal limits  LIPASE, BLOOD    EKG  EKG Interpretation None       Radiology Ct Abdomen Pelvis W Contrast  Result Date: 03/16/2017 CLINICAL DATA:  66 year old female with acute left abdominal and pelvic pain today. EXAM: CT ABDOMEN AND PELVIS WITH CONTRAST TECHNIQUE: Multidetector CT imaging of the abdomen and pelvis was performed using the standard protocol following bolus administration of intravenous contrast. CONTRAST:  100 cc intravenous Isovue-300 COMPARISON:  02/01/2010 lumbar spine radiographs. FINDINGS: Lower chest: No acute abnormality. Mild left lower lobe cylindrical bronchiectasis noted. Hepatobiliary: The liver is unremarkable. The patient is status post cholecystectomy. No biliary dilatation. Pancreas: Unremarkable Spleen: Unremarkable Adrenals/Urinary Tract: The kidneys, adrenal glands and bladder are unremarkable except for small left renal cyst. Stomach/Bowel: Stomach is within normal limits. Appendix appears normal. No evidence of bowel wall thickening, distention, or inflammatory changes. Vascular/Lymphatic: No significant vascular findings are present. No enlarged abdominal or pelvic lymph nodes. Reproductive: Status post hysterectomy. No adnexal masses. Other: No ascites, pneumoperitoneum or abscess. Musculoskeletal: No acute abnormality. Sclerosis within the right sacrum is unchanged from 2011. IMPRESSION: 1. No evidence of acute abnormality. No findings to suggest a cause for this patient's left abdominal pain. 2. Mild left lower lobe cylindrical bronchiectasis. Electronically Signed   By: Harmon PierJeffrey  Hu M.D.   On: 03/16/2017 22:52    Procedures Procedures (including critical care time)  Medications Ordered in ED Medications  iopamidol (ISOVUE-300) 61 % injection (100 mLs Intravenous Contrast Given 03/16/17 2204)     Initial Impression / Assessment and Plan / ED Course  I have  reviewed the triage vital signs and the nursing notes.  Pertinent labs & imaging results that were available during my care of the patient were reviewed by me and considered in my medical decision making (see chart for details).    [name] is a 66 y.o. female who presents to ED for left lower quadrant and left flank pain which began today. On exam, patient is afebrile, hemodynamically stable with tenderness to palpation of LLQ and left flank. UA reviewed: small leuks, but 0-5 WBC's and no bacteria. No urinary symptoms. Doubt UTI. No blood noted, however calcium oxalate crystals present. Stone possible. Mild leukocytosis of 10.9. Diverticulitis in differential. Musculoskeletal strain also possible. Will obtain CT for further evaluation. Offered pain medication, however patient declined.   CT shows no acute abnormalities.   On repeat exam, abdominal exam with no peritoneal signs. Patient discharged home with symptomatic treatment and encouraged to follow up with PCP if symptoms persist. I have also discussed reasons to return immediately to the ER. Patient requesting refill of inhaler while in ED today; refill provided. Patient expresses understanding and agrees with plan as dictated above.     Final Clinical Impressions(s) / ED Diagnoses  Final diagnoses:  Left lower quadrant pain    ED Discharge Orders        Ordered    albuterol (PROVENTIL HFA;VENTOLIN HFA) 108 (90 Base) MCG/ACT inhaler  Every 6 hours PRN     03/16/17 2302       Ward, Chase PicketJaime Pilcher, PA-C 03/16/17 2305    Loren RacerYelverton, Lorriann Hansmann, MD 03/25/17 1455

## 2017-03-16 NOTE — Discharge Instructions (Signed)
It was my pleasure taking care of you today!   Fortunately, your labs and imaging today were very reassuring.   Alternate between Tylenol and Ibuprofen as needed for pain. Ice or heat can be helpful as well.   Follow up with your primary care doctor if symptoms do not improve in the next few days.   Return to ER for new or worsening symptoms, any additional concerns.

## 2017-03-16 NOTE — ED Notes (Signed)
Patient transported to CT 

## 2017-03-29 ENCOUNTER — Encounter (HOSPITAL_COMMUNITY): Payer: Self-pay

## 2017-03-29 ENCOUNTER — Emergency Department (HOSPITAL_COMMUNITY)
Admission: EM | Admit: 2017-03-29 | Discharge: 2017-03-29 | Disposition: A | Discharge status: Home / Self Care | Payer: Medicare Other | Attending: Emergency Medicine | Admitting: Emergency Medicine

## 2017-03-29 ENCOUNTER — Other Ambulatory Visit: Payer: Self-pay

## 2017-03-29 DIAGNOSIS — F17210 Nicotine dependence, cigarettes, uncomplicated: Secondary | ICD-10-CM | POA: Insufficient documentation

## 2017-03-29 DIAGNOSIS — R21 Rash and other nonspecific skin eruption: Secondary | ICD-10-CM | POA: Diagnosis not present

## 2017-03-29 MED ORDER — FAMOTIDINE 20 MG PO TABS: 20.0000 mg | ORAL_TABLET | Freq: Two times a day (BID) | ORAL | 0 refills | Status: DC | PRN

## 2017-03-29 MED ORDER — DIPHENHYDRAMINE HCL 25 MG PO CAPS
25.0000 mg | ORAL_CAPSULE | Freq: Once | ORAL | Status: AC
  Administered 2017-03-29: 25 mg via ORAL
  Filled 2017-03-29: qty 1

## 2017-03-29 MED ORDER — PREDNISONE 10 MG PO TABS: 40.0000 mg | ORAL_TABLET | Freq: Every day | ORAL | 0 refills | Status: AC

## 2017-03-29 MED ORDER — DIPHENHYDRAMINE HCL 25 MG PO TABS: 25.0000 mg | ORAL_TABLET | Freq: Four times a day (QID) | ORAL | 0 refills | Status: DC | PRN

## 2017-03-29 MED ORDER — PREDNISONE 20 MG PO TABS
60.0000 mg | ORAL_TABLET | Freq: Once | ORAL | Status: AC
  Administered 2017-03-29: 60 mg via ORAL
  Filled 2017-03-29: qty 3

## 2017-03-29 NOTE — ED Triage Notes (Addendum)
Pt presents with red, itchy bumps on her neck and arms. She reports that the only new thing she has eaten was some ice cream 2 days ago. She thinks maybe something bit her. No SOB or throat swelling noted in triage. A&Ox4. Ambulatory,

## 2017-03-29 NOTE — Discharge Instructions (Signed)
Start taking steroid as prescribed beginning tomorrow.  You received the first dose in the emergency department today.  Take Benadryl every 6 hours and Pepcid every 12 hours for itching.  I would also call an exterminator for evaluation of bedbugs.  Follow-up with your primary care physician for reevaluation.  Return to the ED if any concerning signs or symptoms develop   Such as fever, difficulty breathing or swallowing, or shortness of breath.

## 2017-03-29 NOTE — ED Provider Notes (Signed)
[facility]-EMERGENCY DEPT Provider Note   CSN: [id number] Arrival date & time: 03/29/17  1611     History   Chief Complaint Chief Complaint  Patient presents with  . Rash    HPI Lenox Ahreggy Taite is a 66 y.o. female who presents today with chief complaint acute onset, progressively worsening rash which began earlier today.  Patient states that she believes an insect bit her last night and she awoke with multiple raised erythematous pruritic lesions which have been progressively worsening, primarily located to the upper extremities, neck, and chest.  She denies difficulty breathing, chest pain, facial swelling, or throat tightness. she has tried Neosporin without significant relief of her symptoms.  She denies any new soaps, lotions, detergents, or shampoos.  No new medications.  No one at home has a similar rash.  The history is provided by the patient.    Past Medical History:  Diagnosis Date  . Arthritis     Patient Active Problem List   Diagnosis Date Noted  . Left shoulder pain 01/11/2015    Past Surgical History:  Procedure Laterality Date  . HAND SURGERY Left   . TOTAL KNEE ARTHROPLASTY      OB History    No data available       Home Medications    Prior to Admission medications   Medication Sig Start Date End Date Taking? Authorizing Provider  albuterol (PROVENTIL HFA;VENTOLIN HFA) 108 (90 Base) MCG/ACT inhaler Inhale 1-2 puffs into the lungs every 6 (six) hours as needed for wheezing or shortness of breath. 03/16/17   Ward, Chase PicketJaime Pilcher, PA-C  diclofenac (VOLTAREN) 50 MG EC tablet Take 1 tablet (50 mg total) by mouth 2 (two) times daily. 03/11/17   Janne NapoleonNeese, Hope M, NP  diphenhydrAMINE (BENADRYL) 25 MG tablet Take 1 tablet (25 mg total) by mouth every 6 (six) hours as needed for itching. 03/29/17   Lazarius Rivkin A, PA-C  famotidine (PEPCID) 20 MG tablet Take 1 tablet (20 mg total) by mouth 2 (two) times daily as needed (Itching). 03/29/17    Luevenia MaxinFawze, Arria Naim A, PA-C  predniSONE (DELTASONE) 10 MG tablet Take 4 tablets (40 mg total) by mouth daily with breakfast for 4 days. 03/29/17 04/02/17  Michela PitcherFawze, Yiannis Tulloch A, PA-C  traMADol (ULTRAM) 50 MG tablet Take 1 tablet (50 mg total) by mouth every 6 (six) hours as needed. Patient not taking: Reported on 03/16/2017 01/11/17   Arthor CaptainHarris, Abigail, PA-C    Family History Family History  Problem Relation Age of Onset  . Cancer Mother   . Cancer Father     Social History Social History   Tobacco Use  . Smoking status: Current Every Day Smoker    Packs/day: 1.00    Types: Cigarettes  . Smokeless tobacco: Never Used  Substance Use Topics  . Alcohol use: No  . Drug use: No     Allergies   Codeine and Penicillins   Review of Systems Review of Systems  Constitutional: Negative for chills and fever.  HENT: Negative for facial swelling and trouble swallowing.   Respiratory: Negative for shortness of breath.   Cardiovascular: Negative for chest pain.  Gastrointestinal: Negative for nausea and vomiting.  Skin: Positive for rash.  Neurological: Negative for headaches.     Physical Exam Updated Vital Signs BP 108/88 (BP Location: Right Arm)   Pulse 88   Temp 98.1 F (36.7 C) (Oral)   Resp 14   SpO2 96%   Physical Exam  Constitutional: She appears  well-developed and well-nourished. No distress.  HENT:  Head: Normocephalic and atraumatic.  Right Ear: External ear normal.  Left Ear: External ear normal.  Mouth/Throat: Oropharynx is clear and moist.  No swelling of the lips or tongue.  Airway is patent.  Eyes: Conjunctivae and EOM are normal. Pupils are equal, round, and reactive to light. Right eye exhibits no discharge. Left eye exhibits no discharge.  Neck: Normal range of motion. Neck supple. No JVD present. No tracheal deviation present.  Cardiovascular: Normal rate.  Pulmonary/Chest: Effort normal.  Abdominal: She exhibits no distension.  Musculoskeletal: She exhibits no edema.   Lymphadenopathy:    She has no cervical adenopathy.  Neurological: She is alert.  Skin: Skin is warm and dry. Rash noted. No erythema.  Multiple raised erythematous lesions between 143mm-7mm localized to the face, neck, chest, and upper extremities.  No tracts noted in between the digits.  Excoriations noted.  No drainage noted.  No fluctuance noted.  Psychiatric: She has a normal mood and affect. Her behavior is normal.  Nursing note and vitals reviewed.    ED Treatments / Results  Labs (all labs ordered are listed, but only abnormal results are displayed) Labs Reviewed - No data to display  EKG  EKG Interpretation None       Radiology No results found.  Procedures Procedures (including critical care time)  Medications Ordered in ED Medications  predniSONE (DELTASONE) tablet 60 mg (not administered)  diphenhydrAMINE (BENADRYL) capsule 25 mg (not administered)     Initial Impression / Assessment and Plan / ED Course  I have reviewed the triage vital signs and the nursing notes.  Pertinent labs & imaging results that were available during my care of the patient were reviewed by me and considered in my medical decision making (see chart for details).     Rash consistent with insect bites.  Patient is afebrile, vital signs are stable and she is nontoxic in appearance.  Patient denies any difficulty breathing or swallowing.  Pt has a patent airway without stridor and is handling secretions without difficulty; no angioedema. No blisters, no pustules, no warmth, no draining sinus tracts, no superficial abscesses, no bullous impetigo, no vesicles, no desquamation, no target lesions with dusky purpura or a central bulla. Not tender to touch. No concern for superimposed infection.  Doubt scabies.  No concern for SJS, TEN, TSS, tick borne illness, syphilis or other life-threatening condition. Will discharge home with short course of steroids, pepcid and recommend Benadryl as needed  for pruritis.    Final Clinical Impressions(s) / ED Diagnoses   Final diagnoses:  Rash    ED Discharge Orders        Ordered    predniSONE (DELTASONE) 10 MG tablet  Daily with breakfast     03/29/17 1747    diphenhydrAMINE (BENADRYL) 25 MG tablet  Every 6 hours PRN     03/29/17 1747    famotidine (PEPCID) 20 MG tablet  2 times daily PRN     03/29/17 1747       Jeanie SewerFawze, Other Atienza A, PA-C 03/29/17 1748    Arby BarrettePfeiffer, Marcy, MD 03/30/17 2147

## 2017-09-03 ENCOUNTER — Emergency Department (HOSPITAL_COMMUNITY)
Admission: EM | Admit: 2017-09-03 | Discharge: 2017-09-03 | Disposition: A | Discharge status: Home / Self Care | Payer: Medicare Other | Attending: Emergency Medicine | Admitting: Emergency Medicine

## 2017-09-03 ENCOUNTER — Encounter (HOSPITAL_COMMUNITY): Payer: Self-pay

## 2017-09-03 ENCOUNTER — Other Ambulatory Visit: Payer: Self-pay

## 2017-09-03 DIAGNOSIS — Z79899 Other long term (current) drug therapy: Secondary | ICD-10-CM | POA: Insufficient documentation

## 2017-09-03 DIAGNOSIS — F17210 Nicotine dependence, cigarettes, uncomplicated: Secondary | ICD-10-CM | POA: Diagnosis not present

## 2017-09-03 DIAGNOSIS — M67471 Ganglion, right ankle and foot: Secondary | ICD-10-CM | POA: Diagnosis not present

## 2017-09-03 DIAGNOSIS — M79671 Pain in right foot: Secondary | ICD-10-CM | POA: Diagnosis present

## 2017-09-03 DIAGNOSIS — Z96659 Presence of unspecified artificial knee joint: Secondary | ICD-10-CM | POA: Insufficient documentation

## 2017-09-03 MED ORDER — IBUPROFEN 600 MG PO TABS: 600.0000 mg | ORAL_TABLET | Freq: Four times a day (QID) | ORAL | 0 refills | Status: DC | PRN

## 2017-09-03 NOTE — Discharge Instructions (Addendum)
Take ibuprofen for pain.  Ice and elevate your foot.  If not improving by next week, follow-up with podiatry.  Return if worsening swelling, redness, fever or any acute new concerning symptoms

## 2017-09-03 NOTE — ED Provider Notes (Signed)
[facility]-EMERGENCY DEPT Provider Note   CSN: [id number] Arrival date & time: 09/03/17  2032     History   Chief Complaint Chief Complaint  Patient presents with  . Foot Pain    HPI [name] is a 67 y.o. female.  HPI [name] is a 67 y.o. female with history of arthritis, presents to emergency department complaining of foot pain.  Patient states she noticed a painful bump that appeared on her foot last night.  She states that she soaked it, and applied an ice pack which made the pain worse.  She denies any known injuries.  She is still able to ambulate on her foot but states the bump is tender.  There is no redness or warmth to the skin over the bump.  There is no fever or chills.  No history of the same.  Did not take any medications prior to coming in.  Past Medical History:  Diagnosis Date  . Arthritis     Patient Active Problem List   Diagnosis Date Noted  . Left shoulder pain 01/11/2015    Past Surgical History:  Procedure Laterality Date  . HAND SURGERY Left   . TOTAL KNEE ARTHROPLASTY       OB History   None      Home Medications    Prior to Admission medications   Medication Sig Start Date End Date Taking? Authorizing Provider  albuterol (PROVENTIL HFA;VENTOLIN HFA) 108 (90 Base) MCG/ACT inhaler Inhale 1-2 puffs into the lungs every 6 (six) hours as needed for wheezing or shortness of breath. 03/16/17   Ward, Chase Picket, PA-C  diclofenac (VOLTAREN) 50 MG EC tablet Take 1 tablet (50 mg total) by mouth 2 (two) times daily. 03/11/17   Janne Napoleon, NP  diphenhydrAMINE (BENADRYL) 25 MG tablet Take 1 tablet (25 mg total) by mouth every 6 (six) hours as needed for itching. 03/29/17   Fawze, Mina A, PA-C  famotidine (PEPCID) 20 MG tablet Take 1 tablet (20 mg total) by mouth 2 (two) times daily as needed (Itching). 03/29/17   Michela Pitcher A, PA-C  traMADol (ULTRAM) 50 MG tablet Take 1 tablet (50 mg total) by mouth every 6 (six)  hours as needed. Patient not taking: Reported on 03/16/2017 01/11/17   Arthor Captain, PA-C    Family History Family History  Problem Relation Age of Onset  . Cancer Mother   . Cancer Father     Social History Social History   Tobacco Use  . Smoking status: Current Every Day Smoker    Packs/day: 1.00    Types: Cigarettes  . Smokeless tobacco: Never Used  Substance Use Topics  . Alcohol use: No  . Drug use: No     Allergies   Codeine and Penicillins   Review of Systems Review of Systems  Constitutional: Negative for chills and fever.  Respiratory: Negative for cough, chest tightness and shortness of breath.   Cardiovascular: Negative for chest pain, palpitations and leg swelling.  Musculoskeletal: Positive for arthralgias. Negative for myalgias, neck pain and neck stiffness.  Skin: Negative for rash.  Neurological: Negative for headaches.  All other systems reviewed and are negative.    Physical Exam Updated Vital Signs BP (!) 156/85 (BP Location: Left Arm)   Pulse 95   Temp 98.6 F (37 C) (Oral)   Resp 18   Ht  (1.676 m)   Wt 90.3 kg (199 lb)   SpO2 95%   BMI 32.12 kg/m  Physical Exam  Constitutional: She appears well-developed and well-nourished. No distress.  Eyes: Conjunctivae are normal.  Neck: Neck supple.  Musculoskeletal:  Small, approximately 1 cm in diameter soft cystic structure to the dorsal right foot, between first and second metatarsal.  It is tender to palpation.  There is no erythema or warmth to the touch over the foot.  Full range of motion of all toes and ankle.  Neurological: She is alert.  Skin: Skin is warm and dry.  Nursing note and vitals reviewed.    ED Treatments / Results  Labs (all labs ordered are listed, but only abnormal results are displayed) Labs Reviewed - No data to display  EKG None  Radiology No results found.  Procedures Procedures (including critical care time)  Medications Ordered in  ED Medications - No data to display   Initial Impression / Assessment and Plan / ED Course  I have reviewed the triage vital signs and the nursing notes.  Pertinent labs & imaging results that were available during my care of the patient were reviewed by me and considered in my medical decision making (see chart for details).     Patient with painful small cystic structure to the top of the right foot.  I suspect this is most likely ganglion cyst.  At this time I am not as concerned about infectious process since she is not running a fever, there is no redness or warmth to the area.  She denies any injuries or falls, do not think she needs any imaging at this time.  Will treat with NSAIDs, rest, elevation, follow-up with family doctor.  She requested to be referred to podiatrist.    Vitals:   09/03/17 2039 09/03/17 2042  BP: (!) 156/85   Pulse: 95   Resp: 18   Temp: 98.6 F (37 C)   TempSrc: Oral   SpO2: 95%   Weight:  90.3 kg (199 lb)  Height:   (1.676 m)     Final Clinical Impressions(s) / ED Diagnoses   Final diagnoses:  Ganglion cyst of right foot    ED Discharge Orders        Ordered    ibuprofen (ADVIL,MOTRIN) 600 MG tablet  Every 6 hours PRN     09/03/17 2125       Jaynie Crumble, PA-C 09/03/17 2125    Wynetta Fines, MD 09/03/17 2138

## 2017-09-03 NOTE — ED Triage Notes (Signed)
About 1800 pm last night noticed lump come up on right foot states now painful.

## 2017-09-12 ENCOUNTER — Ambulatory Visit: Payer: Medicare Other | Admitting: Podiatry

## 2017-09-19 ENCOUNTER — Ambulatory Visit (INDEPENDENT_AMBULATORY_CARE_PROVIDER_SITE_OTHER): Payer: Medicare Other | Admitting: Podiatry

## 2017-09-19 DIAGNOSIS — M79671 Pain in right foot: Secondary | ICD-10-CM

## 2017-09-19 DIAGNOSIS — M779 Enthesopathy, unspecified: Secondary | ICD-10-CM

## 2017-09-19 DIAGNOSIS — B351 Tinea unguium: Secondary | ICD-10-CM

## 2017-10-31 ENCOUNTER — Ambulatory Visit: Payer: Medicare Other | Admitting: Podiatry

## 2017-11-06 ENCOUNTER — Ambulatory Visit: Payer: Medicare Other | Admitting: Podiatry

## 2017-11-09 ENCOUNTER — Emergency Department (HOSPITAL_COMMUNITY)
Admission: EM | Admit: 2017-11-09 | Discharge: 2017-11-09 | Disposition: A | Discharge status: Home / Self Care | Payer: Medicare Other | Attending: Emergency Medicine | Admitting: Emergency Medicine

## 2017-11-09 ENCOUNTER — Other Ambulatory Visit: Payer: Self-pay

## 2017-11-09 ENCOUNTER — Encounter (HOSPITAL_COMMUNITY): Payer: Self-pay

## 2017-11-09 DIAGNOSIS — Z79899 Other long term (current) drug therapy: Secondary | ICD-10-CM | POA: Diagnosis not present

## 2017-11-09 DIAGNOSIS — L239 Allergic contact dermatitis, unspecified cause: Secondary | ICD-10-CM | POA: Insufficient documentation

## 2017-11-09 DIAGNOSIS — Z96652 Presence of left artificial knee joint: Secondary | ICD-10-CM | POA: Diagnosis not present

## 2017-11-09 DIAGNOSIS — T7840XA Allergy, unspecified, initial encounter: Secondary | ICD-10-CM

## 2017-11-09 DIAGNOSIS — F17210 Nicotine dependence, cigarettes, uncomplicated: Secondary | ICD-10-CM | POA: Insufficient documentation

## 2017-11-09 DIAGNOSIS — L299 Pruritus, unspecified: Secondary | ICD-10-CM | POA: Diagnosis present

## 2017-11-09 DIAGNOSIS — L259 Unspecified contact dermatitis, unspecified cause: Secondary | ICD-10-CM

## 2017-11-09 MED ORDER — DIPHENHYDRAMINE HCL 25 MG PO TABS: 12.5000 mg | ORAL_TABLET | Freq: Three times a day (TID) | ORAL | 0 refills | Status: DC | PRN

## 2017-11-09 MED ORDER — DIPHENHYDRAMINE HCL 50 MG/ML IJ SOLN
25.0000 mg | Freq: Once | INTRAMUSCULAR | Status: AC
  Administered 2017-11-09: 25 mg via INTRAVENOUS
  Filled 2017-11-09: qty 1

## 2017-11-09 MED ORDER — FAMOTIDINE 20 MG PO TABS: 20.0000 mg | ORAL_TABLET | Freq: Two times a day (BID) | ORAL | 0 refills | Status: DC

## 2017-11-09 MED ORDER — FAMOTIDINE IN NACL 20-0.9 MG/50ML-% IV SOLN
20.0000 mg | Freq: Once | INTRAVENOUS | Status: AC
  Administered 2017-11-09: 20 mg via INTRAVENOUS
  Filled 2017-11-09: qty 50

## 2017-11-09 MED ORDER — SODIUM CHLORIDE 0.9 % IV BOLUS
1000.0000 mL | Freq: Once | INTRAVENOUS | Status: AC
  Administered 2017-11-09: 1000 mL via INTRAVENOUS

## 2017-11-09 MED ORDER — METHYLPREDNISOLONE SODIUM SUCC 125 MG IJ SOLR
125.0000 mg | Freq: Once | INTRAMUSCULAR | Status: AC
  Administered 2017-11-09: 125 mg via INTRAVENOUS
  Filled 2017-11-09: qty 2

## 2017-11-09 MED ORDER — PREDNISONE 20 MG PO TABS: 40.0000 mg | ORAL_TABLET | Freq: Every day | ORAL | 0 refills | Status: AC

## 2017-11-09 NOTE — ED Triage Notes (Signed)
Per EMS: Pt [facility] deterg[facility] to [facility], and she began itching after she put her first set of clothes from that load of laundry.  PTAR gave 50 mg PO chewable benadryl.  Pt states she took a few doses yesterday of benadryl.

## 2017-11-09 NOTE — Discharge Instructions (Signed)
Stop using the laundry detergent.  You need to rewash all the things that you wash with the detergent with new laundry detergent.  Take medications as directed.  Follow-up with your primary care doctor or the [facility] in 2 to 4 days for further evaluation.  Return the emergency department for any difficulty swallowing your saliva, difficulty eating or drinking, vomiting, difficulty breathing, swelling of her lips or tongue, worsening rash, abdominal pain, chest pain or any other worsening or concerning symptoms.

## 2017-11-09 NOTE — ED Provider Notes (Signed)
[facility]-EMERGENCY DEPT Provider Note   CSN: [id number] Arrival date & time: 11/09/17  1648     History   Chief Complaint Chief Complaint  Patient presents with  . Pruritis    HPI [name] is a 67 y.o. female with history of arthritis who presents for evaluation of pruritus and rash to bilateral upper and lower extremities that began last night.  Patient reports that one day prior to onset of symptoms, she did use a new detergent which she never used before.  Patient reports that she slept in her sheets that she recently washed and states that she broke out into a diffuse urticaria.  Patient reports that she took Benadryl last night which slightly improved the rash but she states it returned this morning.  Patient states that she was outside for a long time yesterday but does not recall any bug bites.  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  Patient reports that while she was at home, she did feel like she had some scratchy throat.  She states that she felt like her lip was slightly slightly swollen but did not have any difficulty tolerating p.o., secretions, difficulty breathing.  Patient reports that the rash is pruritic but not painful.  Patient denies any chest pain, Dr. breathing, abdominal pain, nausea/vomiting.  The history is provided by the patient.    Past Medical History:  Diagnosis Date  . Arthritis     Patient Active Problem List   Diagnosis Date Noted  . Left shoulder pain 01/11/2015    Past Surgical History:  Procedure Laterality Date  . HAND SURGERY Left   . TOTAL KNEE ARTHROPLASTY       OB History   None      Home Medications    Prior to Admission medications   Medication Sig Start Date End Date Taking? Authorizing Provider  albuterol (PROVENTIL HFA;VENTOLIN HFA) 108 (90 Base) MCG/ACT inhaler Inhale 1-2 puffs into the lungs every 6 (six) hours as needed for wheezing or  shortness of breath. 03/16/17   Ward, Chase Picket, PA-C  diclofenac (VOLTAREN) 50 MG EC tablet Take 1 tablet (50 mg total) by mouth 2 (two) times daily. 03/11/17   Janne Napoleon, NP  diphenhydrAMINE (BENADRYL) 25 MG tablet Take 0.5 tablets (12.5 mg total) by mouth every 8 (eight) hours as needed for up to 5 days. 11/09/17 11/14/17  Maxwell Caul, PA-C  famotidine (PEPCID) 20 MG tablet Take 1 tablet (20 mg total) by mouth 2 (two) times daily for 5 days. 11/09/17 11/14/17  Maxwell Caul, PA-C  ibuprofen (ADVIL,MOTRIN) 600 MG tablet Take 1 tablet (600 mg total) by mouth every 6 (six) hours as needed. 09/03/17   Kirichenko, Lemont Fillers, PA-C  predniSONE (DELTASONE) 20 MG tablet Take 2 tablets (40 mg total) by mouth daily for 3 days. 11/09/17 11/12/17  Maxwell Caul, PA-C  traMADol (ULTRAM) 50 MG tablet Take 1 tablet (50 mg total) by mouth every 6 (six) hours as needed. Patient not taking: Reported on 03/16/2017 01/11/17   Arthor Captain, PA-C    Family History Family History  Problem Relation Age of Onset  . Cancer Mother   . Cancer Father     Social History Social History   Tobacco Use  . Smoking status: Current Every Day Smoker    Packs/day: 1.00    Types: Cigarettes  . Smokeless tobacco: Never Used  Substance Use  Topics  . Alcohol use: No  . Drug use: No     Allergies   Codeine and Penicillins   Review of Systems Review of Systems  Constitutional: Negative for fever.  Respiratory: Negative for cough and shortness of breath.   Cardiovascular: Negative for chest pain.  Gastrointestinal: Negative for abdominal pain, nausea and vomiting.  Genitourinary: Negative for dysuria and hematuria.  Skin: Positive for rash.  Neurological: Negative for headaches.  All other systems reviewed and are negative.    Physical Exam Updated Vital Signs BP 119/74   Pulse 99   Temp 98.5 F (36.9 C) (Oral)   Resp 17   Ht 5' 5.5" (1.664 m)   Wt 77.1 kg (170 lb)   SpO2 99%   BMI 27.86  kg/m   Physical Exam  Constitutional: She is oriented to person, place, and time. She appears well-developed and well-nourished.  HENT:  Head: Normocephalic and atraumatic.  Mouth/Throat: Oropharynx is clear and moist and mucous membranes are normal.  Airways patent, phonation is intact.  No evidence of oral angioedema.  Posterior oropharynx is without any erythema, edema.  Uvula is midline.  No trismus.  No facial swelling.  Face is symmetric in appearance.  No oral lesions.  Eyes: Pupils are equal, round, and reactive to light. Conjunctivae, EOM and lids are normal.  Neck: Full passive range of motion without pain.  Cardiovascular: Normal rate, regular rhythm, normal heart sounds and normal pulses. Exam reveals no gallop and no friction rub.  No murmur heard. Pulmonary/Chest: Effort normal and breath sounds normal.  Lungs clear to auscultation bilaterally.  Symmetric chest rise.  No wheezing, rales, rhonchi.  Able to speak in full sentences without any difficulty.  Abdominal: Soft. Normal appearance. There is no tenderness. There is no rigidity and no guarding.  Musculoskeletal: Normal range of motion.  Neurological: She is alert and oriented to person, place, and time.  Skin: Skin is warm and dry. Capillary refill takes less than 2 seconds. Rash noted. Rash is urticarial.  Diffuse urticarial rash noted to bilateral upper and lower extremities.  No rash noted on palms or soles of feet.  Psychiatric: She has a normal mood and affect. Her speech is normal.  Nursing note and vitals reviewed.    ED Treatments / Results  Labs (all labs ordered are listed, but only abnormal results are displayed) Labs Reviewed - No data to display  EKG None  Radiology No results found.  Procedures Procedures (including critical care time)  Medications Ordered in ED Medications  sodium chloride 0.9 % bolus 1,000 mL (1,000 mLs Intravenous New Bag/Given 11/09/17 1812)  diphenhydrAMINE (BENADRYL)  injection 25 mg (25 mg Intravenous Given 11/09/17 1808)  methylPREDNISolone sodium succinate (SOLU-MEDROL) 125 mg/2 mL injection 125 mg (125 mg Intravenous Given 11/09/17 1809)  famotidine (PEPCID) IVPB 20 mg premix (0 mg Intravenous Stopped 11/09/17 1842)     Initial Impression / Assessment and Plan / ED Course  I have reviewed the triage vital signs and the nursing notes.  Pertinent labs & imaging results that were available during my care of the patient were reviewed by me and considered in my medical decision making (see chart for details).     67 year old female who presents for evaluation of pruritic rash began last night.  States that she recently used a new laundry detergent which she had never used before.  Reports symptoms began yesterday.  Does not recall any insect bites or eating anywhere in food.  States that  this morning her throat felt scratchy but she was not having any difficulty tolerating secretions or p.o.  Reports she felt like maybe her lower lip was slightly swollen. Patient is afebrile, non-toxic appearing, sitting comfortably on examination table.' Vital signs reviewed and stable.  Airways patent, phonation is intact.  No evidence of oral angioedema.  Face is symmetric in appearance.  No evidence of anaphylaxis that would require epi in the ED.  Patient does have diffuse urticaria to bilateral upper and lower extremities.  Suspect this is consistent with allergic reaction contact dermatitis.  History/physical exam is not concerning for RMSF, SJS, TENS.  Plan to give Benadryl, steroids, Pepcid here in the ED and observe.  Reevaluation.  Patient reports improvement in symptoms.  She states she is not having any more pruritus.  Additionally, evaluation of her rash seems that it is improved.  She still has some slight urticaria noted to her legs but otherwise it is improved significantly.  Complete a p.o. challenge.  Patient able to tolerate p.o. without any difficulty.  She  denies any throat swelling, lip or tongue swelling, difficulty breathing.  Vital signs are stable.  O2 sats remained greater than 95% on room air throughout her entire duration.  Suspect this is her likely result of contact dermatitis.  Plan to send her home on Benadryl, prednisone, Pepcid for symptomatic relief.  Encourage primary care follow-up. Patient had ample opportunity for questions and discussion. All patient's questions were answered with full understanding. Strict return precautions discussed. Patient expresses understanding and agreement to plan.   Final Clinical Impressions(s) / ED Diagnoses   Final diagnoses:  Allergic reaction, initial encounter  Contact dermatitis, unspecified contact dermatitis type, unspecified trigger    ED Discharge Orders        Ordered    predniSONE (DELTASONE) 20 MG tablet  Daily     11/09/17 1931    diphenhydrAMINE (BENADRYL) 25 MG tablet  Every 8 hours PRN     11/09/17 1931    famotidine (PEPCID) 20 MG tablet  2 times daily     11/09/17 1931       Rosana Hoes 11/09/17 Westley Gambles, MD 11/09/17 2200

## 2017-11-09 NOTE — ED Notes (Signed)
Bed: WA05 Expected date:  Expected time:  Means of arrival:  Comments: 

## 2017-12-24 NOTE — Progress Notes (Signed)
  Subjective:  Patient ID: [name], female    DOB: [birth year],  MRN: [id number]  Chief Complaint  Patient presents with  . Callouses    left 5th metatarsal, bottom of foot - small cyst 3rd metatarsal top midfoot right  . Nail Problem    discolored thick toenails    67 y.o. female presents with the above complaint.  Reports pain to the outside of the left foot.  Thinks that he has a small cyst in the third metatarsal area  Review of Systems: Negative except as noted in the HPI. Denies N/V/F/Ch.  Past Medical History:  Diagnosis Date  . Arthritis     Current Outpatient Medications:  .  albuterol (PROVENTIL HFA;VENTOLIN HFA) 108 (90 Base) MCG/ACT inhaler, Inhale 1-2 puffs into the lungs every 6 (six) hours as needed for wheezing or shortness of breath., Disp: 1 Inhaler, Rfl: 0 .  diclofenac (VOLTAREN) 50 MG EC tablet, Take 1 tablet (50 mg total) by mouth 2 (two) times daily., Disp: 15 tablet, Rfl: 0 .  diphenhydrAMINE (BENADRYL) 25 MG tablet, Take 0.5 tablets (12.5 mg total) by mouth every 8 (eight) hours as needed for up to 5 days., Disp: 20 tablet, Rfl: 0 .  famotidine (PEPCID) 20 MG tablet, Take 1 tablet (20 mg total) by mouth 2 (two) times daily for 5 days., Disp: 10 tablet, Rfl: 0 .  ibuprofen (ADVIL,MOTRIN) 600 MG tablet, Take 1 tablet (600 mg total) by mouth every 6 (six) hours as needed., Disp: 30 tablet, Rfl: 0 .  traMADol (ULTRAM) 50 MG tablet, Take 1 tablet (50 mg total) by mouth every 6 (six) hours as needed. (Patient not taking: Reported on 03/16/2017), Disp: 15 tablet, Rfl: 0  Social History   Tobacco Use  Smoking Status Current Every Day Smoker  . Packs/day: 1.00  . Types: Cigarettes  Smokeless Tobacco Never Used    Allergies  Allergen Reactions  . Codeine Rash  . Penicillins Rash    Has patient had a PCN reaction causing immediate rash, facial/tongue/throat swelling, SOB or lightheadedness with hypotension: Yes Has patient had a PCN reaction causing severe  rash involving mucus membranes or skin necrosis: No Has patient had a PCN reaction that required hospitalization: No Has patient had a PCN reaction occurring within the last 10 years: No If all of the above answers are "NO", then may proceed with Cephalosporin use.    Objective:  There were no vitals filed for this visit. There is no height or weight on file to calculate BMI. Constitutional Well developed. Well nourished.  Vascular Dorsalis pedis pulses palpable bilaterally. Posterior tibial pulses palpable bilaterally. Capillary refill normal to all digits.  No cyanosis or clubbing noted. Pedal hair growth normal.  Neurologic Normal speech. Oriented to person, place, and time. Epicritic sensation to light touch grossly present bilaterally.  Dermatologic  nails x10 elongated thickened dystrophic No open wounds. No skin lesions.  Orthopedic: Normal joint ROM without pain or crepitus bilaterally. No visible deformities. No bony tenderness.   Radiographs: Taken reviewed no acute fracture dislocation Assessment:   1. Foot pain, right   2. Capsulitis   3. Onychomycosis    Plan:  Patient was evaluated and treated and all questions answered.  Nail dystrophy -Unlikely fungal etiology -Educated on self-care  Capsulitis -Padding dispensed -Educated proper shoe gear  Return in about 6 weeks (around 10/31/2017) for Capsulitis.

## 2018-10-28 ENCOUNTER — Other Ambulatory Visit: Payer: Self-pay | Admitting: Family Medicine

## 2018-10-28 DIAGNOSIS — Z1231 Encounter for screening mammogram for malignant neoplasm of breast: Secondary | ICD-10-CM

## 2018-12-14 ENCOUNTER — Ambulatory Visit: Payer: Medicare Other

## 2018-12-23 ENCOUNTER — Ambulatory Visit: Payer: Medicare Other

## 2019-02-02 ENCOUNTER — Other Ambulatory Visit: Payer: Self-pay

## 2019-02-02 ENCOUNTER — Ambulatory Visit
Admission: RE | Admit: 2019-02-02 | Discharge: 2019-02-02 | Disposition: A | Discharge status: Home / Self Care | Payer: Medicare Other | Source: Ambulatory Visit | Attending: Family Medicine | Admitting: Family Medicine

## 2019-02-02 DIAGNOSIS — Z1231 Encounter for screening mammogram for malignant neoplasm of breast: Secondary | ICD-10-CM

## 2019-09-07 ENCOUNTER — Encounter (HOSPITAL_COMMUNITY): Payer: Self-pay | Admitting: *Deleted

## 2019-09-07 ENCOUNTER — Other Ambulatory Visit: Payer: Self-pay

## 2019-09-07 ENCOUNTER — Emergency Department (HOSPITAL_COMMUNITY): Payer: Medicare Other

## 2019-09-07 DIAGNOSIS — M79672 Pain in left foot: Secondary | ICD-10-CM | POA: Diagnosis present

## 2019-09-07 DIAGNOSIS — Y9301 Activity, walking, marching and hiking: Secondary | ICD-10-CM | POA: Insufficient documentation

## 2019-09-07 DIAGNOSIS — F17210 Nicotine dependence, cigarettes, uncomplicated: Secondary | ICD-10-CM | POA: Diagnosis not present

## 2019-09-07 DIAGNOSIS — M1990 Unspecified osteoarthritis, unspecified site: Secondary | ICD-10-CM | POA: Insufficient documentation

## 2019-09-07 NOTE — ED Triage Notes (Signed)
Pt reports pain on the bottom of her left foot, with what feels like knots on her foot. Pain started today, took OTC ibuprofen without relief.

## 2019-09-08 ENCOUNTER — Emergency Department (HOSPITAL_COMMUNITY)
Admission: EM | Admit: 2019-09-08 | Discharge: 2019-09-08 | Disposition: A | Discharge status: Home / Self Care | Payer: Medicare Other | Attending: Emergency Medicine | Admitting: Emergency Medicine

## 2019-09-08 DIAGNOSIS — M79672 Pain in left foot: Secondary | ICD-10-CM

## 2019-09-08 MED ORDER — NAPROXEN 500 MG PO TABS: 500.0000 mg | ORAL_TABLET | Freq: Two times a day (BID) | ORAL | 0 refills | Status: AC

## 2019-09-08 NOTE — ED Provider Notes (Signed)
[facility] DEPT Provider Note   CSN: [id number] Arrival date & time: 09/07/19  1846     History Chief Complaint  Patient presents with  . Foot Pain    [name] is a 69 y.o. female.  69 y.o female with a PMH of Arthritis presents to the ED with a chief complaint of left foot pain which began less than 12 hours ago.  She reports coming back from a walk, describes a aching pain to her left heel with radiation to the frontal aspect of her foot.  She tried taking some ibuprofen without improvement in her symptoms.  She describes the pain exacerbated after the walk.  No alleviating factors.  Denies any trauma, prior history of diabetes, fevers.   The history is provided by the patient.       Past Medical History:  Diagnosis Date  . Arthritis     Patient Active Problem List   Diagnosis Date Noted  . Left shoulder pain 01/11/2015    Past Surgical History:  Procedure Laterality Date  . HAND SURGERY Left   . TOTAL KNEE ARTHROPLASTY       OB History   No obstetric history on file.     Family History  Problem Relation Age of Onset  . Cancer Mother   . Cancer Father     Social History   Tobacco Use  . Smoking status: Current Every Day Smoker    Packs/day: 1.00    Types: Cigarettes  . Smokeless tobacco: Never Used  Substance Use Topics  . Alcohol use: No  . Drug use: No    Home Medications Prior to Admission medications   Medication Sig Start Date End Date Taking? Authorizing Provider  albuterol (PROVENTIL HFA;VENTOLIN HFA) 108 (90 Base) MCG/ACT inhaler Inhale 1-2 puffs into the lungs every 6 (six) hours as needed for wheezing or shortness of breath. 03/16/17   Ward, Ozella Almond, PA-C  diclofenac (VOLTAREN) 50 MG EC tablet Take 1 tablet (50 mg total) by mouth 2 (two) times daily. 03/11/17   Ashley Murrain, NP  diphenhydrAMINE (BENADRYL) 25 MG tablet Take 0.5 tablets (12.5 mg total) by mouth every 8 (eight) hours as needed for  up to 5 days. 11/09/17 11/14/17  Volanda Napoleon, PA-C  famotidine (PEPCID) 20 MG tablet Take 1 tablet (20 mg total) by mouth 2 (two) times daily for 5 days. 11/09/17 11/14/17  Volanda Napoleon, PA-C  ibuprofen (ADVIL,MOTRIN) 600 MG tablet Take 1 tablet (600 mg total) by mouth every 6 (six) hours as needed. 09/03/17   Kirichenko, Tatyana, PA-C  naproxen (NAPROSYN) 500 MG tablet Take 1 tablet (500 mg total) by mouth 2 (two) times daily for 7 days. 09/08/19 09/15/19  Janeece Fitting, PA-C  traMADol (ULTRAM) 50 MG tablet Take 1 tablet (50 mg total) by mouth every 6 (six) hours as needed. Patient not taking: Reported on 03/16/2017 01/11/17   Margarita Mail, PA-C    Allergies    Codeine and Penicillins  Review of Systems   Review of Systems  Constitutional: Negative for fever.  Musculoskeletal: Positive for arthralgias.    Physical Exam Updated Vital Signs BP 123/87 (BP Location: Right Arm)   Pulse 80   Temp 98.7 F (37.1 C) (Oral)   Resp 16   Ht 5\' 5"  (1.651 m)   Wt 86.2 kg   SpO2 96%   BMI 31.62 kg/m   Physical Exam Vitals and nursing note reviewed.  Constitutional:  Appearance: Normal appearance.  HENT:     Head: Normocephalic and atraumatic.     Mouth/Throat:     Mouth: Mucous membranes are moist.  Eyes:     Pupils: Pupils are equal, round, and reactive to light.  Cardiovascular:     Rate and Rhythm: Normal rate.     Pulses:          Dorsalis pedis pulses are 2+ on the left side.       Posterior tibial pulses are 2+ on the left side.  Pulmonary:     Effort: Pulmonary effort is normal.  Abdominal:     General: Abdomen is flat.  Musculoskeletal:     Cervical back: Normal range of motion and neck supple.  Feet:     Left foot:     Skin integrity: Callus present. No ulcer, blister, skin breakdown, erythema or warmth.     Comments: Pulses present, capillary refill is intact, no erythema or edema noted. Sensation is intact.  Skin:    General: Skin is warm and dry.    Neurological:     Mental Status: She is alert and oriented to person, place, and time.     ED Results / Procedures / Treatments   Labs (all labs ordered are listed, but only abnormal results are displayed) Labs Reviewed - No data to display  EKG None  Radiology DG Foot Complete Left  Result Date: 09/07/2019 CLINICAL DATA:  Plantar foot pain EXAM: LEFT FOOT - COMPLETE 3+ VIEW COMPARISON:  None. FINDINGS: There is no evidence of fracture or dislocation. There is no evidence of arthropathy or other focal bone abnormality. Soft tissues are unremarkable. Small plantar calcaneal spur. IMPRESSION: No acute osseous abnormality.  Small plantar calcaneal spur Electronically Signed   By: Jasmine Pang M.D.   On: 09/07/2019 23:02    Procedures Procedures (including critical care time)  Medications Ordered in ED Medications - No data to display  ED Course  I have reviewed the triage vital signs and the nursing notes.  Pertinent labs & imaging results that were available during my care of the patient were reviewed by me and considered in my medical decision making (see chart for details).    MDM Rules/Calculators/A&P   Patient with medical history of arthritis presents to the ED with complaints of left foot pain which began this afternoon around 3 PM.  No trauma, fevers, prior history of diabetes.  Has taken ibuprofen for symptomatic control without improvement. She is ambulatory in the ED with a steady gate. No falls.   During evaluation foot appears neurovascularly intact, sensation is unremarkable.  No erythema, edema, calluses are present.  X-ray of her left foot reveal small calcaneal spurs, we discussed how this is likely exacerbating pain specially as this occurred after patient went on a walk.  Patient is requesting a postop shoe at this time, will provide this for patient.  X-ray did not show any fracture, dislocation, or acute pathology.  Will discharge patient on a short course of  anti-inflammatories.  Dietary services were recommended to her along with attached to the chart.  Patient is stable for discharge.  Portions of this note were generated with Scientist, clinical (histocompatibility and immunogenetics). Dictation errors may occur despite best attempts at proofreading.  Final Clinical Impression(s) / ED Diagnoses Final diagnoses:  Acute foot pain, left    Rx / DC Orders ED Discharge Orders         Ordered    naproxen (NAPROSYN) 500 MG tablet  2 times daily     09/08/19 0057           Claude Manges, PA-C 09/08/19 0102    Palumbo, April, MD 09/08/19 4158

## 2019-09-08 NOTE — Discharge Instructions (Addendum)
Your x-ray today was normal.  We provided you a postop shoe for symptomatic control.  I have provided prescription for anti-inflammatories, please take 1 tablet twice a day for the next 7 days.  The number to podiatry is attached to your chart, please schedule an appointment as needed.

## 2019-10-13 ENCOUNTER — Other Ambulatory Visit: Payer: Self-pay | Admitting: Podiatry

## 2019-10-13 ENCOUNTER — Telehealth: Payer: Self-pay | Admitting: Podiatry

## 2019-10-13 ENCOUNTER — Encounter: Payer: Self-pay | Admitting: Podiatry

## 2019-10-13 ENCOUNTER — Other Ambulatory Visit: Payer: Self-pay

## 2019-10-13 ENCOUNTER — Ambulatory Visit (INDEPENDENT_AMBULATORY_CARE_PROVIDER_SITE_OTHER): Payer: Medicare Other | Admitting: Podiatry

## 2019-10-13 DIAGNOSIS — M722 Plantar fascial fibromatosis: Secondary | ICD-10-CM | POA: Diagnosis not present

## 2019-10-13 MED ORDER — MELOXICAM 15 MG PO TABS: 15.0000 mg | ORAL_TABLET | Freq: Every day | ORAL | 1 refills | Status: DC

## 2019-10-13 MED ORDER — MELOXICAM 15 MG PO TABS
15.0000 mg | ORAL_TABLET | Freq: Every day | ORAL | 1 refills | Status: DC
Start: 2019-10-13 — End: 2019-10-13

## 2019-10-13 MED ORDER — METHYLPREDNISOLONE 4 MG PO TBPK: ORAL_TABLET | ORAL | 0 refills | Status: DC

## 2019-10-13 MED ORDER — METHYLPREDNISOLONE 4 MG PO TBPK
ORAL_TABLET | ORAL | 0 refills | Status: DC
Start: 2019-10-13 — End: 2019-10-13

## 2019-10-13 NOTE — Telephone Encounter (Signed)
Both prescriptions sent to the [facility] on [address] Thanks, Dr. Logan Bores

## 2019-10-13 NOTE — Progress Notes (Signed)
   Subjective: 69 y.o. female presenting today as a referral from the emergency department for left heel pain.  Patient states she has had left heel pain now for several weeks.  She went to the emergency department on 09/08/2019 and x-rays were taken.  Sudden onset and the patient denies any known history of trauma.  She has been taking naproxen and ibuprofen and soaking her foot in Epsom salt.  She has also been weightbearing in a postoperative shoe she presents for follow-up treatment evaluation   Past Medical History:  Diagnosis Date  . Arthritis      Objective: Physical Exam General: The patient is alert and oriented x3 in no acute distress.  Dermatology: Skin is warm, dry and supple bilateral lower extremities. Negative for open lesions or macerations bilateral.   Vascular: Dorsalis Pedis and Posterior Tibial pulses palpable bilateral.  Capillary fill time is immediate to all digits.  Neurological: Epicritic and protective threshold intact bilateral.   Musculoskeletal: Tenderness to palpation to the plantar aspect of the left heel along the plantar fascia. All other joints range of motion within normal limits bilateral. Strength 5/5 in all groups bilateral.   Radiographic exam: Normal osseous mineralization. Joint spaces preserved. No fracture/dislocation/boney destruction. No other soft tissue abnormalities or radiopaque foreign bodies.   Assessment: 1. Plantar fasciitis left foot  Plan of Care:  1. Patient evaluated. Xrays reviewed.   2. Injection of 0.5cc Celestone soluspan injected into the left plantar fascia.  3. Rx for Medrol Dose Pak placed 4. Rx for Meloxicam ordered for patient. 5.  Continue postoperative shoe as needed 6. Instructed patient regarding therapies and modalities at home to alleviate symptoms.  7. Return to clinic in 4 weeks.     Felecia Shelling, DPM [facility]  Dr. Felecia Shelling, DPM    [address] [place], [place] [address]                Office [phone]  Fax [phone]

## 2019-10-13 NOTE — Telephone Encounter (Signed)
Pt had two prescriptions sent in today but called back stating they were sent to the incorrect pharmacy.  Please resend Meloxicam and Medrol Doepak to [facility] on [address]

## 2019-11-22 ENCOUNTER — Other Ambulatory Visit: Payer: Self-pay

## 2019-11-22 ENCOUNTER — Ambulatory Visit (INDEPENDENT_AMBULATORY_CARE_PROVIDER_SITE_OTHER): Payer: Medicare Other | Admitting: Podiatry

## 2019-11-22 DIAGNOSIS — M722 Plantar fascial fibromatosis: Secondary | ICD-10-CM | POA: Diagnosis not present

## 2019-11-22 NOTE — Progress Notes (Signed)
   Subjective: 69 y.o. female presenting today for follow-up evaluation of plantar fasciitis of the left foot.  Patient states she is doing much better.  She actually has no pain today.  She took the anti-inflammatory medication as instructed and has been wearing good supportive sneakers.  She states that the injection helped significantly.  Past Medical History:  Diagnosis Date  . Arthritis      Objective: Physical Exam General: The patient is alert and oriented x3 in no acute distress.  Dermatology: Skin is warm, dry and supple bilateral lower extremities. Negative for open lesions or macerations bilateral.   Vascular: Dorsalis Pedis and Posterior Tibial pulses palpable bilateral.  Capillary fill time is immediate to all digits.  Neurological: Epicritic and protective threshold intact bilateral.   Musculoskeletal: Negative for tenderness to palpation to the plantar aspect of the left heel along the plantar fascia. All other joints range of motion within normal limits bilateral. Strength 5/5 in all groups bilateral.   Radiographic exam: Normal osseous mineralization. Joint spaces preserved. No fracture/dislocation/boney destruction. No other soft tissue abnormalities or radiopaque foreign bodies.   Assessment: 1. Plantar fasciitis left foot  Plan of Care:  1. Patient evaluated. 2.  Continue meloxicam as needed 3.  Continue wearing good supportive sneakers 4.  Return to clinic as needed   Felecia Shelling, DPM [facility]  Dr. Felecia Shelling, DPM    [address] [place], [place] [address]                Office [phone]  Fax [phone]

## 2020-04-15 DIAGNOSIS — H269 Unspecified cataract: Secondary | ICD-10-CM

## 2020-04-15 HISTORY — DX: Unspecified cataract: H26.9

## 2020-10-16 ENCOUNTER — Other Ambulatory Visit: Payer: Self-pay

## 2020-10-16 ENCOUNTER — Encounter (HOSPITAL_COMMUNITY): Payer: Self-pay

## 2020-10-16 ENCOUNTER — Emergency Department (HOSPITAL_COMMUNITY)
Admission: EM | Admit: 2020-10-16 | Discharge: 2020-10-16 | Disposition: A | Discharge status: Home / Self Care | Payer: Medicare Other | Attending: Emergency Medicine | Admitting: Emergency Medicine

## 2020-10-16 DIAGNOSIS — X58XXXA Exposure to other specified factors, initial encounter: Secondary | ICD-10-CM | POA: Diagnosis not present

## 2020-10-16 DIAGNOSIS — F17210 Nicotine dependence, cigarettes, uncomplicated: Secondary | ICD-10-CM | POA: Diagnosis not present

## 2020-10-16 DIAGNOSIS — Z96652 Presence of left artificial knee joint: Secondary | ICD-10-CM | POA: Diagnosis not present

## 2020-10-16 DIAGNOSIS — Y92832 Beach as the place of occurrence of the external cause: Secondary | ICD-10-CM | POA: Insufficient documentation

## 2020-10-16 DIAGNOSIS — S0502XA Injury of conjunctiva and corneal abrasion without foreign body, left eye, initial encounter: Secondary | ICD-10-CM | POA: Insufficient documentation

## 2020-10-16 MED ORDER — TETRACAINE HCL 0.5 % OP SOLN
1.0000 [drp] | Freq: Once | OPHTHALMIC | Status: AC
  Administered 2020-10-16: 1 [drp] via OPHTHALMIC
  Filled 2020-10-16: qty 4

## 2020-10-16 MED ORDER — FLUORESCEIN SODIUM 1 MG OP STRP
1.0000 | ORAL_STRIP | Freq: Once | OPHTHALMIC | Status: AC
  Administered 2020-10-16: 1 via OPHTHALMIC
  Filled 2020-10-16: qty 1

## 2020-10-16 MED ORDER — SULFACETAMIDE SODIUM 10 % OP SOLN
1.0000 [drp] | Freq: Once | OPHTHALMIC | Status: AC
  Administered 2020-10-16: 1 [drp] via OPHTHALMIC
  Filled 2020-10-16: qty 15

## 2020-10-16 NOTE — ED Triage Notes (Signed)
Pt complains of blurred vision after getting sand in her eyes at the beach yesterday.

## 2020-10-16 NOTE — Discharge Instructions (Addendum)
Use eyedrops as directed. It is important for you to follow-up with eye specialist listed below. Return to the ER if you start to experience worsening pain, blurry vision, swelling around her eye

## 2020-10-16 NOTE — ED Provider Notes (Signed)
[facility]-EMERGENCY DEPT Provider Note   CSN: [id number] Arrival date & time: 10/16/20  2026     History Chief Complaint  Patient presents with   Blurred Vision    Tahirah Schussler is a 70 y.o. female with a past medical history of arthritis presenting to the ED for blurry vision for body sensation in her left eye.  States that she was at the beach when she was concerned that [place] flew into her eye.  This happened yesterday.  States that she tried to irrigate the area but was continuously rubbing it.  She would like to make sure there is nothing scratched in her eye.  She denies any pain with EOMs, loss of vision, swelling around her eye.  She does not wear contacts or glasses  HPI     Past Medical History:  Diagnosis Date   Arthritis     Patient Active Problem List   Diagnosis Date Noted   Left shoulder pain 01/11/2015    Past Surgical History:  Procedure Laterality Date   HAND SURGERY Left    TOTAL KNEE ARTHROPLASTY       OB History   No obstetric history on file.     Family History  Problem Relation Age of Onset   Cancer Mother    Cancer Father     Social History   Tobacco Use   Smoking status: Every Day    Packs/day: 1.00    Pack years: 0.00    Types: Cigarettes   Smokeless tobacco: Never  Vaping Use   Vaping Use: Never used  Substance Use Topics   Alcohol use: No   Drug use: No    Home Medications Prior to Admission medications   Medication Sig Start Date End Date Taking? Authorizing Provider  albuterol (PROVENTIL HFA;VENTOLIN HFA) 108 (90 Base) MCG/ACT inhaler Inhale 1-2 puffs into the lungs every 6 (six) hours as needed for wheezing or shortness of breath. 03/16/17   Ward, Chase Picket, PA-C  diclofenac (VOLTAREN) 50 MG EC tablet Take 1 tablet (50 mg total) by mouth 2 (two) times daily. 03/11/17   Janne Napoleon, NP  diphenhydrAMINE (BENADRYL) 25 MG tablet Take 0.5 tablets (12.5 mg total) by mouth every 8 (eight) hours as  needed for up to 5 days. 11/09/17 11/14/17  Maxwell Caul, PA-C  famotidine (PEPCID) 20 MG tablet Take 1 tablet (20 mg total) by mouth 2 (two) times daily for 5 days. 11/09/17 11/14/17  Maxwell Caul, PA-C  ibuprofen (ADVIL,MOTRIN) 600 MG tablet Take 1 tablet (600 mg total) by mouth every 6 (six) hours as needed. 09/03/17   Kirichenko, Lemont Fillers, PA-C  meloxicam (MOBIC) 15 MG tablet Take 1 tablet (15 mg total) by mouth daily. 10/13/19   Felecia Shelling, DPM  methylPREDNISolone (MEDROL DOSEPAK) 4 MG TBPK tablet 6 day dose pack - take as directed 10/13/19   Felecia Shelling, DPM  traMADol (ULTRAM) 50 MG tablet Take 1 tablet (50 mg total) by mouth every 6 (six) hours as needed. Patient not taking: Reported on 03/16/2017 01/11/17   Arthor Captain, PA-C    Allergies    Codeine and Penicillins  Review of Systems   Review of Systems  Constitutional:  Negative for fever.  HENT:  Negative for facial swelling.   Eyes:  Positive for redness and visual disturbance. Negative for photophobia, pain, discharge and itching.   Physical Exam Updated Vital Signs BP 134/84   Pulse 91   Temp 98.6 F (37  C) (Oral)   Resp 16   Ht 5\' 5"  (1.651 m)   Wt 61.7 kg   SpO2 97%   BMI 22.63 kg/m   Physical Exam Vitals and nursing note reviewed.  Constitutional:      General: She is not in acute distress.    Appearance: She is well-developed. She is not diaphoretic.  HENT:     Head: Normocephalic and atraumatic.  Eyes:     General: No scleral icterus.    Conjunctiva/sclera: Conjunctivae normal.     Pupils: Pupils are equal, round, and reactive to light.     Comments: Right eye with injected conjunctiva, no eyelid swelling or erythema or tenderness to palpation.  Mild clear tearful drainage noted.  No foreign bodies noted.  No pain with EOMs.  No chemosis, proptosis, or consensual photophobia. Fluorescein stain with small corneal abrasion noted at the 9:00 region.  No foreign bodies, dendritic lesions, ulcerations,  negative Sidel sign.   Pulmonary:     Effort: Pulmonary effort is normal. No respiratory distress.  Musculoskeletal:     Cervical back: Normal range of motion.  Skin:    Findings: No rash.  Neurological:     Mental Status: She is alert.    ED Results / Procedures / Treatments   Labs (all labs ordered are listed, but only abnormal results are displayed) Labs Reviewed - No data to display  EKG None  Radiology No results found.  Procedures Procedures   Medications Ordered in ED Medications  sulfacetamide (BLEPH-10) 10 % ophthalmic solution 1 drop (has no administration in time range)  fluorescein ophthalmic strip 1 strip (1 strip Left Eye Given 10/16/20 2108)  tetracaine (PONTOCAINE) 0.5 % ophthalmic solution 1 drop (1 drop Left Eye Given 10/16/20 2108)    ED Course  I have reviewed the triage vital signs and the nursing notes.  Pertinent labs & imaging results that were available during my care of the patient were reviewed by me and considered in my medical decision making (see chart for details).    MDM Rules/Calculators/A&P                          70 year old female with left eye irritation, foreign body sensation and blurry vision after being concerned that she got sand in her eye at the beach yesterday.  Tried to irrigated but states that she was continuously rubbing it due to discomfort.  Denies any loss of vision.  Family members were concerned that she may have scratched her eye.  On exam pupils are equal and reactive to light.  There is a small corneal abrasion noted at the 9:00 region.  She will be placed on sulfacetamide drops which I have given her.  No swelling around the eye that would concern me for cellulitis.  We will have her follow-up with ophthalmology.  Return precautions given.    Patient is hemodynamically stable, in NAD, and able to ambulate in the ED. Evaluation does not show pathology that would require ongoing emergent intervention or inpatient  treatment. I explained the diagnosis to the patient. Pain has been managed and has no complaints prior to discharge. Patient is comfortable with above plan and is stable for discharge at this time. All questions were answered prior to disposition. Strict return precautions for returning to the ED were discussed. Encouraged follow up with PCP.   An After Visit Summary was printed and given to the patient.   Portions of this  note were generated with Scientist, clinical (histocompatibility and immunogenetics). Dictation errors may occur despite best attempts at proofreading.  Final Clinical Impression(s) / ED Diagnoses Final diagnoses:  Abrasion of left cornea, initial encounter    Rx / DC Orders ED Discharge Orders     None        Dietrich Pates, PA-C 10/16/20 2142    Terrilee Files, MD 10/17/20 1145

## 2020-10-24 ENCOUNTER — Encounter (HOSPITAL_COMMUNITY): Payer: Self-pay

## 2020-10-24 ENCOUNTER — Other Ambulatory Visit: Payer: Self-pay

## 2020-10-24 ENCOUNTER — Emergency Department (HOSPITAL_COMMUNITY)
Admission: EM | Admit: 2020-10-24 | Discharge: 2020-10-24 | Disposition: A | Discharge status: Home / Self Care | Payer: Medicare Other | Attending: Emergency Medicine | Admitting: Emergency Medicine

## 2020-10-24 DIAGNOSIS — Z96659 Presence of unspecified artificial knee joint: Secondary | ICD-10-CM | POA: Diagnosis not present

## 2020-10-24 DIAGNOSIS — H538 Other visual disturbances: Secondary | ICD-10-CM | POA: Diagnosis not present

## 2020-10-24 DIAGNOSIS — H579 Unspecified disorder of eye and adnexa: Secondary | ICD-10-CM

## 2020-10-24 DIAGNOSIS — F17210 Nicotine dependence, cigarettes, uncomplicated: Secondary | ICD-10-CM | POA: Insufficient documentation

## 2020-10-24 MED ORDER — FLUORESCEIN SODIUM 1 MG OP STRP
1.0000 | ORAL_STRIP | Freq: Once | OPHTHALMIC | Status: AC
  Administered 2020-10-24: 1 via OPHTHALMIC
  Filled 2020-10-24: qty 1

## 2020-10-24 MED ORDER — TETRACAINE HCL 0.5 % OP SOLN
2.0000 [drp] | Freq: Once | OPHTHALMIC | Status: AC
  Administered 2020-10-24: 2 [drp] via OPHTHALMIC
  Filled 2020-10-24: qty 4

## 2020-10-24 NOTE — ED Triage Notes (Signed)
Pt BIB [facility]. Pt was at beach over the weekend and got sand in her eye. Pt went to the ED while at the beach and received eye drops for the blurriness. Pt denies any pain but c/o blurriness in the eye.  Vital signs were:  148/84  102-pulse 16 RR 98% on RA 97.8 Temp

## 2020-10-24 NOTE — ED Provider Notes (Signed)
[facility]-EMERGENCY DEPT Provider Note   CSN: [id number] Arrival date & time: 10/24/20  0934     History Chief Complaint  Patient presents with  . Eye Problem     [name] is a 70 y.o. female.  The history is provided by the patient.  Eye Problem Location:  Both eyes Quality: blurry vision. Severity:  Mild Onset quality:  Gradual Duration:  2 weeks Timing:  Constant Progression:  Partially resolved Chronicity:  New Context comment:  Treated for abrasion in eye but blurry vision still there, cant see eye doctor yet Relieved by:  Nothing Worsened by:  Nothing Associated symptoms: blurred vision, decreased vision, itching and redness   Associated symptoms: no crusting, no discharge, no double vision, no inflammation, no nausea, no photophobia and no vomiting       Past Medical History:  Diagnosis Date  . Arthritis     Patient Active Problem List   Diagnosis Date Noted  . Left shoulder pain 01/11/2015    Past Surgical History:  Procedure Laterality Date  . HAND SURGERY Left   . TOTAL KNEE ARTHROPLASTY       OB History   No obstetric history on file.     Family History  Problem Relation Age of Onset  . Cancer Mother   . Cancer Father     Social History   Tobacco Use  . Smoking status: Every Day    Packs/day: 1.00    Pack years: 0.00    Types: Cigarettes  . Smokeless tobacco: Never  Vaping Use  . Vaping Use: Never used  Substance Use Topics  . Alcohol use: No  . Drug use: No    Home Medications Prior to Admission medications   Medication Sig Start Date End Date Taking? Authorizing Provider  albuterol (PROVENTIL HFA;VENTOLIN HFA) 108 (90 Base) MCG/ACT inhaler Inhale 1-2 puffs into the lungs every 6 (six) hours as needed for wheezing or shortness of breath. 03/16/17   Ward, Chase Picket, PA-C  diclofenac (VOLTAREN) 50 MG EC tablet Take 1 tablet (50 mg total) by mouth 2 (two) times daily. 03/11/17   Janne Napoleon, NP   diphenhydrAMINE (BENADRYL) 25 MG tablet Take 0.5 tablets (12.5 mg total) by mouth every 8 (eight) hours as needed for up to 5 days. 11/09/17 11/14/17  Maxwell Caul, PA-C  famotidine (PEPCID) 20 MG tablet Take 1 tablet (20 mg total) by mouth 2 (two) times daily for 5 days. 11/09/17 11/14/17  Maxwell Caul, PA-C  ibuprofen (ADVIL,MOTRIN) 600 MG tablet Take 1 tablet (600 mg total) by mouth every 6 (six) hours as needed. 09/03/17   Kirichenko, Lemont Fillers, PA-C  meloxicam (MOBIC) 15 MG tablet Take 1 tablet (15 mg total) by mouth daily. 10/13/19   Felecia Shelling, DPM  methylPREDNISolone (MEDROL DOSEPAK) 4 MG TBPK tablet 6 day dose pack - take as directed 10/13/19   Felecia Shelling, DPM  traMADol (ULTRAM) 50 MG tablet Take 1 tablet (50 mg total) by mouth every 6 (six) hours as needed. Patient not taking: Reported on 03/16/2017 01/11/17   Arthor Captain, PA-C    Allergies    Codeine and Penicillins  Review of Systems   Review of Systems  Constitutional:  Negative for chills and fever.  HENT:  Negative for ear pain and sore throat.   Eyes:  Positive for blurred vision, redness, itching and visual disturbance. Negative for double vision, photophobia, pain and discharge.  Respiratory:  Negative for cough and shortness  of breath.   Cardiovascular:  Negative for chest pain and palpitations.  Gastrointestinal:  Negative for abdominal pain, nausea and vomiting.  Genitourinary:  Negative for dysuria and hematuria.  Musculoskeletal:  Negative for arthralgias and back pain.  Skin:  Negative for color change and rash.  Neurological:  Negative for seizures and syncope.  All other systems reviewed and are negative.  Physical Exam Updated Vital Signs BP (!) 141/86 (BP Location: Right Arm)   Pulse 86   Ht 5\' 5"  (1.651 m)   Wt 61.7 kg   SpO2 99%   BMI 22.64 kg/m   Physical Exam Vitals and nursing note reviewed.  Constitutional:      General: She is not in acute distress.    Appearance: She is  well-developed. She is not ill-appearing.  HENT:     Head: Normocephalic and atraumatic.     Mouth/Throat:     Mouth: Mucous membranes are moist.  Eyes:     Extraocular Movements: Extraocular movements intact.     Pupils: Pupils are equal, round, and reactive to light.     Comments: No obvious discharge from both eyes, patient unable to see any letters on my eye chart because too blurry in both eyes, she is able to make out some fingers when held close to her eye, no obvious corneal abrasion, pressure in both eyes is within normal limits, pupils appear to have some clouding and conjunctiva overall appear unremarkable, do not see any obvious cell and flare.  Cardiovascular:     Rate and Rhythm: Normal rate and regular rhythm.     Pulses: Normal pulses.     Heart sounds: Normal heart sounds. No murmur heard. Pulmonary:     Effort: Pulmonary effort is normal. No respiratory distress.     Breath sounds: Normal breath sounds.  Abdominal:     Palpations: Abdomen is soft.     Tenderness: There is no abdominal tenderness.  Musculoskeletal:     Cervical back: Neck supple.  Skin:    General: Skin is warm and dry.     Capillary Refill: Capillary refill takes less than 2 seconds.  Neurological:     General: No focal deficit present.     Mental Status: She is alert and oriented to person, place, and time.     Cranial Nerves: No cranial nerve deficit.     Sensory: No sensory deficit.     Motor: No weakness.     Coordination: Coordination normal.    ED Results / Procedures / Treatments   Labs (all labs ordered are listed, but only abnormal results are displayed) Labs Reviewed - No data to display  EKG None  Radiology No results found.  Procedures Procedures   Medications Ordered in ED Medications  fluorescein ophthalmic strip 1 strip (has no administration in time range)  tetracaine (PONTOCAINE) 0.5 % ophthalmic solution 2 drop (has no administration in time range)    ED Course   I have reviewed the triage vital signs and the nursing notes.  Pertinent labs & imaging results that were available during my care of the patient were reviewed by me and considered in my medical decision making (see chart for details).    MDM Rules/Calculators/A&P                          [name] is here with eye problem.  Normal vitals.  No fever.  Has currently been on antibiotics for left eye  problem/corneal abrasion.  However she states both eyes are blurry.  There is no swelling around the eye or eyelids.  There is no issues with extraocular movements bilaterally.  Neurological exam is normal otherwise.  She has only the ability to see large objects with her vision bilaterally, pressure in both eyes is under 20.  No fluorescein uptake on exam.  Overall there appears to be some clouding of her pupils bilaterally.  There is no clouding of the iris.  Pupils are equal and reactive bilaterally.  Unable to see her retina.  No obvious cell or flare or hyphema.  No concern for stroke.  Believe she has a primary eye issue.  Could be cataracts or some other infectious type process.  Was able to arrange for follow-up with Dr. Vonna Kotyk with ophthalmology today.  Discharged and given instructions to follow-up with them today.  Understands return precautions.  This chart was dictated using voice recognition software.  Despite best efforts to proofread,  errors can occur which can change the documentation meaning.   Final Clinical Impression(s) / ED Diagnoses Final diagnoses:  None    Rx / DC Orders ED Discharge Orders     None        Virgina Norfolk, DO 10/24/20 1052

## 2020-10-24 NOTE — Discharge Instructions (Addendum)
Follow-up with ophthalmologist today at 1:15 PM.  Please show up early for appointment.

## 2020-10-24 NOTE — ED Notes (Signed)
Patient calling family member to pick her up but is adamant she does not want to wait in the room.  Patient walked herself out to the lobby to wait for family.

## 2020-11-26 ENCOUNTER — Encounter (HOSPITAL_COMMUNITY): Payer: Self-pay

## 2020-11-26 ENCOUNTER — Emergency Department (HOSPITAL_COMMUNITY)
Admission: EM | Admit: 2020-11-26 | Discharge: 2020-11-27 | Disposition: A | Discharge status: Home / Self Care | Payer: Medicare Other | Attending: Emergency Medicine | Admitting: Emergency Medicine

## 2020-11-26 DIAGNOSIS — Z5321 Procedure and treatment not carried out due to patient leaving prior to being seen by health care provider: Secondary | ICD-10-CM | POA: Diagnosis not present

## 2020-11-26 DIAGNOSIS — H538 Other visual disturbances: Secondary | ICD-10-CM | POA: Insufficient documentation

## 2020-11-26 DIAGNOSIS — R519 Headache, unspecified: Secondary | ICD-10-CM | POA: Insufficient documentation

## 2020-11-26 DIAGNOSIS — H5712 Ocular pain, left eye: Secondary | ICD-10-CM | POA: Diagnosis present

## 2020-11-26 DIAGNOSIS — H02846 Edema of left eye, unspecified eyelid: Secondary | ICD-10-CM | POA: Diagnosis not present
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
## 2020-11-27 ENCOUNTER — Ambulatory Visit: Payer: Self-pay | Admitting: Nurse Practitioner

## 2020-12-27 ENCOUNTER — Ambulatory Visit: Payer: Medicare Other | Admitting: Nurse Practitioner

## 2021-02-15 ENCOUNTER — Ambulatory Visit: Payer: Medicare Other | Admitting: Family

## 2021-02-19 ENCOUNTER — Ambulatory Visit: Payer: Medicare Other | Admitting: Family

## 2021-02-23 ENCOUNTER — Ambulatory Visit: Payer: Medicare Other | Admitting: Family

## 2021-02-23 ENCOUNTER — Other Ambulatory Visit: Payer: Self-pay

## 2021-02-23 ENCOUNTER — Encounter: Payer: Self-pay | Admitting: Orthopedic Surgery

## 2021-02-23 ENCOUNTER — Ambulatory Visit (INDEPENDENT_AMBULATORY_CARE_PROVIDER_SITE_OTHER): Payer: Medicare Other | Admitting: Orthopedic Surgery

## 2021-02-23 VITALS — BP 138/88 | HR 94 | Temp 97.8°F | Resp 16 | Ht 65.0 in | Wt 148.2 lb

## 2021-02-23 DIAGNOSIS — Z1211 Encounter for screening for malignant neoplasm of colon: Secondary | ICD-10-CM | POA: Diagnosis not present

## 2021-02-23 DIAGNOSIS — Z1231 Encounter for screening mammogram for malignant neoplasm of breast: Secondary | ICD-10-CM | POA: Diagnosis not present

## 2021-02-23 DIAGNOSIS — Z780 Asymptomatic menopausal state: Secondary | ICD-10-CM

## 2021-02-23 DIAGNOSIS — Z0000 Encounter for general adult medical examination without abnormal findings: Secondary | ICD-10-CM

## 2021-02-23 DIAGNOSIS — H04123 Dry eye syndrome of bilateral lacrimal glands: Secondary | ICD-10-CM

## 2021-02-23 DIAGNOSIS — Z6824 Body mass index (BMI) 24.0-24.9, adult: Secondary | ICD-10-CM

## 2021-02-23 DIAGNOSIS — M542 Cervicalgia: Secondary | ICD-10-CM
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
## 2021-02-23 NOTE — Patient Instructions (Signed)
Please fast prior to having lab work done- you may only have water or black coffee  Referral for colonoscopy made- they should call to schedule  You may call [facility] to schedule mammogram/bone density test  Voltaren gel- may purchase at grocery store- use three to four times daily

## 2021-02-28 ENCOUNTER — Other Ambulatory Visit: Payer: Self-pay | Admitting: Orthopedic Surgery

## 2021-02-28 DIAGNOSIS — E2839 Other primary ovarian failure: Secondary | ICD-10-CM

## 2021-03-01 ENCOUNTER — Other Ambulatory Visit: Payer: Self-pay | Admitting: Orthopedic Surgery

## 2021-03-01 DIAGNOSIS — E2839 Other primary ovarian failure: Secondary | ICD-10-CM

## 2021-03-02 ENCOUNTER — Encounter: Payer: Self-pay | Admitting: Gastroenterology

## 2021-03-26 ENCOUNTER — Other Ambulatory Visit: Payer: Self-pay

## 2021-03-26 ENCOUNTER — Other Ambulatory Visit: Payer: Medicare Other

## 2021-03-26 DIAGNOSIS — R739 Hyperglycemia, unspecified: Secondary | ICD-10-CM | POA: Diagnosis not present

## 2021-03-26 DIAGNOSIS — Z0000 Encounter for general adult medical examination without abnormal findings: Secondary | ICD-10-CM

## 2021-03-26 DIAGNOSIS — E1165 Type 2 diabetes mellitus with hyperglycemia: Secondary | ICD-10-CM | POA: Diagnosis not present

## 2021-03-28 ENCOUNTER — Other Ambulatory Visit: Payer: Self-pay | Admitting: Orthopedic Surgery

## 2021-03-28 ENCOUNTER — Telehealth: Payer: Self-pay | Admitting: Orthopedic Surgery

## 2021-03-28 DIAGNOSIS — E1165 Type 2 diabetes mellitus with hyperglycemia: Secondary | ICD-10-CM

## 2021-03-28 DIAGNOSIS — Z794 Long term (current) use of insulin: Secondary | ICD-10-CM

## 2021-03-28 LAB — CBC WITH DIFFERENTIAL/PLATELET
Absolute Monocytes: 390 cells/uL (ref 200–950)
Basophils Absolute: 32 cells/uL (ref 0–200)
Basophils Relative: 0.5 %
Eosinophils Absolute: 218 cells/uL (ref 15–500)
Eosinophils Relative: 3.4 %
HCT: 46.1 % — ABNORMAL HIGH (ref 35.0–45.0)
Hemoglobin: 16 g/dL — ABNORMAL HIGH (ref 11.7–15.5)
Lymphs Abs: 2586 cells/uL (ref 850–3900)
MCH: 31.4 pg (ref 27.0–33.0)
MCHC: 34.7 g/dL (ref 32.0–36.0)
MCV: 90.4 fL (ref 80.0–100.0)
MPV: 10.5 fL (ref 7.5–12.5)
Monocytes Relative: 6.1 %
Neutro Abs: 3174 cells/uL (ref 1500–7800)
Neutrophils Relative %: 49.6 %
Platelets: 219 10*3/uL (ref 140–400)
RBC: 5.1 10*6/uL (ref 3.80–5.10)
RDW: 12.2 % (ref 11.0–15.0)
Total Lymphocyte: 40.4 %
WBC: 6.4 10*3/uL (ref 3.8–10.8)

## 2021-03-28 LAB — COMPREHENSIVE METABOLIC PANEL
AG Ratio: 1.5 (calc) (ref 1.0–2.5)
ALT: 8 U/L (ref 6–29)
AST: 10 U/L (ref 10–35)
Albumin: 4.3 g/dL (ref 3.6–5.1)
Alkaline phosphatase (APISO): 136 U/L (ref 37–153)
BUN: 12 mg/dL (ref 7–25)
CO2: 21 mmol/L (ref 20–32)
Calcium: 9.6 mg/dL (ref 8.6–10.4)
Chloride: 98 mmol/L (ref 98–110)
Creat: 0.63 mg/dL (ref 0.50–1.05)
Globulin: 2.8 g/dL (calc) (ref 1.9–3.7)
Glucose, Bld: 435 mg/dL — ABNORMAL HIGH (ref 65–139)
Potassium: 4 mmol/L (ref 3.5–5.3)
Sodium: 130 mmol/L — ABNORMAL LOW (ref 135–146)
Total Bilirubin: 0.5 mg/dL (ref 0.2–1.2)
Total Protein: 7.1 g/dL (ref 6.1–8.1)

## 2021-03-28 LAB — TEST AUTHORIZATION

## 2021-03-28 LAB — LIPID PANEL
Cholesterol: 183 mg/dL (ref <200)
HDL: 54 mg/dL (ref >=50)
LDL Cholesterol (Calc): 109 mg/dL (calc) — ABNORMAL HIGH
Non-HDL Cholesterol (Calc): 129 mg/dL (calc) (ref <130)
Total CHOL/HDL Ratio: 3.4 (calc) (ref <5.0)
Triglycerides: 103 mg/dL (ref <150)

## 2021-03-28 LAB — HEMOGLOBIN A1C: Hgb A1c MFr Bld: >14 % of total Hgb — ABNORMAL HIGH (ref <5.7)

## 2021-03-28 MED ORDER — ROSUVASTATIN CALCIUM 5 MG PO TABS: 5.0000 mg | ORAL_TABLET | Freq: Every day | ORAL | 3 refills | Status: DC

## 2021-03-28 MED ORDER — INSULIN GLARGINE 100 UNIT/ML ~~LOC~~ SOLN: 20.0000 [IU] | Freq: Every day | SUBCUTANEOUS | 11 refills | Status: DC

## 2021-03-28 MED ORDER — ASPIRIN 81 MG PO TBEC: 81.0000 mg | DELAYED_RELEASE_TABLET | Freq: Every day | ORAL | 12 refills | Status: DC

## 2021-03-28 MED ORDER — ROSUVASTATIN CALCIUM 10 MG PO TABS: 10.0000 mg | ORAL_TABLET | Freq: Every day | ORAL | 3 refills | Status: DC

## 2021-03-28 MED ORDER — LISINOPRIL 5 MG PO TABS: 5.0000 mg | ORAL_TABLET | Freq: Every day | ORAL | 3 refills | Status: DC

## 2021-03-28 NOTE — Progress Notes (Signed)
Discussed new diagnosis of T2DM and prescriptions with patient. Plan to do teaching about insulin tomorrow at appointment.

## 2021-03-28 NOTE — Telephone Encounter (Signed)
Pt left vm on referral line stating she needed to talk with Amy- no details.   She shared that she has a new phone #, I have update that in Epic.  Thanks,  [name]

## 2021-03-28 NOTE — Telephone Encounter (Signed)
Patient stated that she needs her Insulin sent to [facility] NOT [facility] because she has no way there.  Requesting Rx to be sent to correct pharmacy.  ReFaxed.

## 2021-03-29 ENCOUNTER — Telehealth: Payer: Self-pay

## 2021-03-29 ENCOUNTER — Encounter: Payer: Medicare Other | Admitting: Orthopedic Surgery

## 2021-03-29 DIAGNOSIS — Z794 Long term (current) use of insulin: Secondary | ICD-10-CM

## 2021-03-29 MED ORDER — BLOOD GLUCOSE METER KIT: PACK | 0 refills | Status: AC

## 2021-03-29 NOTE — Telephone Encounter (Signed)
Amy Fargo,NP requested to order a glucometer, strips and lancets for patient.

## 2021-04-04 ENCOUNTER — Ambulatory Visit: Payer: Medicare Other

## 2021-04-05 ENCOUNTER — Ambulatory Visit: Payer: Medicare Other | Admitting: Orthopedic Surgery

## 2021-04-12 ENCOUNTER — Other Ambulatory Visit: Payer: Self-pay

## 2021-04-12 ENCOUNTER — Encounter: Payer: Self-pay | Admitting: Orthopedic Surgery

## 2021-04-12 ENCOUNTER — Telehealth: Payer: Self-pay

## 2021-04-12 ENCOUNTER — Ambulatory Visit (INDEPENDENT_AMBULATORY_CARE_PROVIDER_SITE_OTHER): Payer: Medicare Other | Admitting: Orthopedic Surgery

## 2021-04-12 VITALS — BP 120/68 | HR 84 | Temp 96.9°F | Ht 65.0 in | Wt 147.0 lb

## 2021-04-12 DIAGNOSIS — E119 Type 2 diabetes mellitus without complications: Secondary | ICD-10-CM | POA: Diagnosis not present

## 2021-04-12 DIAGNOSIS — E1165 Type 2 diabetes mellitus with hyperglycemia: Secondary | ICD-10-CM | POA: Insufficient documentation

## 2021-04-12 DIAGNOSIS — Z794 Long term (current) use of insulin: Secondary | ICD-10-CM

## 2021-04-12 MED ORDER — ACCU-CHEK MULTICLIX LANCETS MISC: 1.0000 | 2 refills | Status: AC | PRN

## 2021-04-12 MED ORDER — GLUCOSE BLOOD VI STRP: 1.0000 | ORAL_STRIP | 2 refills | Status: DC | PRN

## 2021-04-12 NOTE — Patient Instructions (Addendum)
Please check blood sugar with meter every morning- check your blood sugar before eating and drinking  Limit foods with sugar and breads  Normal blood sugar- 80-100  If blood sugar is less than 60- please drink some juice and recheck sugars in 15 minutes  Please notify doctor if sugars are more than 400   Please have pharmacist show you how to take insulin

## 2021-04-12 NOTE — Telephone Encounter (Signed)
Patient called while at pharmacy, frustrated that her prescriptions were sent to the wrong pharmacy ([facility]).   Prescriptions should've been sent to [facility] at [facility] [place].   Verbal given to pharmacy. Pharmacy request lancets and strips be sent over. Sent to [facility] city)

## 2021-04-12 NOTE — Progress Notes (Signed)
Careteam: Patient Care Team: Yvonna Alanis, NP as PCP - General (Adult Health Nurse Practitioner)  Seen by: Windell Moulding, AGNP-C  PLACE OF SERVICE:  [facility]  Advanced Directive information    Allergies  Allergen Reactions   Codeine Rash   Penicillins Rash    Has patient had a PCN reaction causing immediate rash, facial/tongue/throat swelling, SOB or lightheadedness with hypotension: Yes Has patient had a PCN reaction causing severe rash involving mucus membranes or skin necrosis: No Has patient had a PCN reaction that required hospitalization: No Has patient had a PCN reaction occurring within the last 10 years: No If all of the above answers are "NO", then may proceed with Cephalosporin use.     Chief Complaint  Patient presents with   Medical Management of Chronic Issues    Patient presents today for a 1 month follow-up.   Quality Metric Gaps    DEXA scan, mammogram, colonoscopy, Hep C screening, pneumonia, zoster, flu, COVID     HPI: Patient is a 70 y.o. female seen today for medical management of chronic conditions.   Lab work discussed with patient.   She was diagnosed with T2DM 2 weeks ago. Prescriptions for glucometer supplies and insulin sent into pharmacy. She was advised to bring in her supplies to this appointment but has not. She reports issues with transportation and it is hard for her to attend doctor visits or pick of medication. We used office glucometer to demonstrate how to check blood sugar. Blood glucose 323 in office. She has been advised to ask pharmacist about insulin administration.   Diabetic diet also discussed. She plans to cut juice and high sugary food out of diet.   Symptoms of hypoglycemia discussed.   Review of Systems:  Review of Systems  Constitutional:  Negative for chills, fever, malaise/fatigue and weight loss.  Respiratory:  Negative for cough, shortness of breath and wheezing.   Cardiovascular:  Negative for chest pain and leg  swelling.  Neurological:  Negative for weakness.  Endo/Heme/Allergies:  Negative for polydipsia.  Psychiatric/Behavioral:  Negative for depression. The patient is not nervous/anxious.    Past Medical History:  Diagnosis Date   Arthritis    Past Surgical History:  Procedure Laterality Date   HAND SURGERY Left    TOTAL KNEE ARTHROPLASTY     Social History:   reports that she quit smoking about 1 years ago. Her smoking use included cigarettes. She has a 30.00 pack-year smoking history. She has never used smokeless tobacco. She reports that she does not drink alcohol and does not use drugs.  Family History  Problem Relation Age of Onset   Cancer Mother    Cancer Father     Medications: Patient's Medications  New Prescriptions   No medications on file  Previous Medications   ACETAMINOPHEN (TYLENOL EXTRA STRENGTH PO)    Take by mouth as needed.   ASPIRIN 81 MG EC TABLET    Take 1 tablet (81 mg total) by mouth daily. Swallow whole.   BLOOD GLUCOSE METER KIT AND SUPPLIES    Dispense based on patient and insurance preference. Use up to four times daily as directed. (FOR ICD-10 E10.9, E11.9).   CARBOXYMETHYLCELLULOSE (REFRESH PLUS) 0.5 % SOLN    Place 1 drop into both eyes 3 (three) times daily as needed.   INSULIN GLARGINE (SEMGLEE) 100 UNIT/ML INJECTION    Inject 0.2 mLs (20 Units total) into the skin at bedtime.   LISINOPRIL (ZESTRIL) 5 MG TABLET  Take 1 tablet (5 mg total) by mouth daily.   ROSUVASTATIN (CRESTOR) 5 MG TABLET    Take 1 tablet (5 mg total) by mouth daily.  Modified Medications   No medications on file  Discontinued Medications   No medications on file    Physical Exam:  Vitals:   04/12/21 0833  BP: 120/68  Pulse: 84  Temp: (!) 96.9 F (36.1 C)  SpO2: 96%  Weight: 147 lb (66.7 kg)  Height: _0  (1.651 m)   Body mass index is 24.46 kg/m. Wt Readings from Last 3 Encounters:  04/12/21 147 lb (66.7 kg)  02/23/21 148 lb 3.2 oz (67.2 kg)  10/24/20 136 lb  0.4 oz (61.7 kg)    Physical Exam Vitals reviewed.  Constitutional:      General: She is not in acute distress. HENT:     Head: Normocephalic.  Eyes:     General:        Right eye: No discharge.        Left eye: No discharge.  Cardiovascular:     Rate and Rhythm: Normal rate and regular rhythm.     Pulses: Normal pulses.     Heart sounds: Normal heart sounds. No murmur heard. Pulmonary:     Effort: Pulmonary effort is normal. No respiratory distress.     Breath sounds: Normal breath sounds. No wheezing.  Abdominal:     General: Bowel sounds are normal. There is no distension.     Palpations: Abdomen is soft.     Tenderness: There is no abdominal tenderness.  Musculoskeletal:     Right lower leg: No edema.     Left lower leg: No edema.  Skin:    General: Skin is warm and dry.     Capillary Refill: Capillary refill takes less than 2 seconds.  Neurological:     General: No focal deficit present.     Mental Status: She is alert and oriented to person, place, and time.  Psychiatric:        Mood and Affect: Mood normal.        Behavior: Behavior normal.    Labs reviewed: Basic Metabolic Panel: Recent Labs    03/26/21 1040  NA 130*  K 4.0  CL 98  CO2 21  GLUCOSE 435*  BUN 12  CREATININE 0.63  CALCIUM 9.6   Liver Function Tests: Recent Labs    03/26/21 1040  AST 10  ALT 8  BILITOT 0.5  PROT 7.1   No results for input(s): LIPASE, AMYLASE in the last 8760 hours. No results for input(s): AMMONIA in the last 8760 hours. CBC: Recent Labs    03/26/21 1040  WBC 6.4  NEUTROABS 3,174  HGB 16.0*  HCT 46.1*  MCV 90.4  PLT 219   Lipid Panel: Recent Labs    03/26/21 1040  CHOL 183  HDL 54  LDLCALC 109*  TRIG 103  CHOLHDL 3.4   TSH: No results for input(s): TSH in the last 8760 hours. A1C: Lab Results  Component Value Date   HGBA1C >14.0 (H) 03/26/2021     Assessment/Plan 1. Diabetes mellitus type 2, insulin dependent (HCC) - A1c > 14.0  03/26/2021 - started on insulin glargine 20 units qhs - check blood glucose every morning - diabetic foot exam- future - diabetic eye exam- future - cont asa,lisinopril, crestor - diabetes education done today- teach back glucose monitor  Total time: 31 minutes. Greater than 50% of total time spent doing patient education regarding  diabetic diet, glucometer use, and hypoglycemia.    Next appt: 06/07/2021  Windell Moulding, [facility] [phone]

## 2021-04-17 ENCOUNTER — Telehealth: Payer: Self-pay | Admitting: *Deleted

## 2021-04-17 NOTE — Telephone Encounter (Signed)
Patient called and stated that her blood sugar is running around 300-335.   I asked patient how she was giving her insulin and she stated that she is injecting 10 units into her abdomen every morning.   I reviewed her medication list and it says she is suppose to be doing 20 units at bedtime.  Patient confirmed she has only been doing 10 units.   I instructed her to do 20 units at bedtime. To dial the pen up to 20 units and inject into her stomach and hold the pen there for 5-7 seconds to get all of the insulin. She wanted to know if the pen resets back to 0 and I told her yes.   She also stated that she had a broken Insulin Pen that was leaking insulin. I instructed her to take that back to the pharmacy to get them to exchange.   Patient has an appointment with Amy on 1/5 but I told her not to hesitate to call if she needed anything. Agreed.

## 2021-04-17 NOTE — Telephone Encounter (Signed)
Thank you  for update

## 2021-04-19 ENCOUNTER — Ambulatory Visit: Payer: Medicare Other | Admitting: Orthopedic Surgery

## 2021-04-20 ENCOUNTER — Other Ambulatory Visit: Payer: Self-pay

## 2021-04-20 ENCOUNTER — Ambulatory Visit (AMBULATORY_SURGERY_CENTER): Payer: Medicare Other

## 2021-04-20 VITALS — Ht 65.5 in | Wt 114.0 lb

## 2021-04-20 DIAGNOSIS — Z1211 Encounter for screening for malignant neoplasm of colon: Secondary | ICD-10-CM

## 2021-04-20 MED ORDER — NA SULFATE-K SULFATE-MG SULF 17.5-3.13-1.6 GM/177ML PO SOLN: 1.0000 | Freq: Once | ORAL | 0 refills | Status: AC

## 2021-04-20 NOTE — Progress Notes (Signed)
Pre visit completed via phone call; Patient verified name, DOB, and address; No egg or soy allergy known to patient  No issues known to pt with past sedation with any surgeries or procedures Patient denies ever being told they had issues or difficulty with intubation  No FH of Malignant Hyperthermia Pt is not on diet pills Pt is not on home 02  Pt is not on blood thinners  Pt denies issues with constipation  No A fib or A flutter NO PA's for preps discussed with pt in PV today  Discussed with pt there will be an out-of-pocket cost for prep and that varies from $0 to 70 +  dollars - pt verbalized understanding  Due to the COVID-19 pandemic we are asking patients to follow certain guidelines in PV and the [facility]   Pt aware of COVID protocols and [facility] guidelines  

## 2021-04-26 ENCOUNTER — Telehealth: Payer: Self-pay

## 2021-04-26 NOTE — Telephone Encounter (Signed)
Patient called back upset demanding to speak with Amy and upset that I had not called her back.   Patient stated again she is severely constipated and should have never been placed on insulin. Patient states she needs her medication changed ASAP.  Patient was advise that if her pain has increased she should seek medical attention at urgent care. Patient replied that she is not going to urgent care.

## 2021-04-26 NOTE — Telephone Encounter (Signed)
Discussed insulin in more detail with patient. She believes insulin is causing constipation. I recommended drinking more water and eating more vegetables in her diet. Stool softener also suggested.  She is upset with taking insulin and would like to try pills. At this time I only recommend insulin.

## 2021-04-26 NOTE — Telephone Encounter (Signed)
Noted  

## 2021-04-26 NOTE — Telephone Encounter (Signed)
Incoming call received from patient stating the insulin that Amy started her on 04/12/21 has made her severely constipated. Patient states she has not had a bowel movement since the 29th of December.  Patient states she usually has a 2-3 BM's daily. Patient is c/o stomach cramping as if she was about to start her menstrual.  Please advise

## 2021-04-26 NOTE — Progress Notes (Signed)
This encounter was created in error - please disregard.  This encounter was created in error - please disregard.

## 2021-05-01 ENCOUNTER — Ambulatory Visit: Payer: Medicare Other | Admitting: Family Medicine

## 2021-05-04 ENCOUNTER — Encounter: Payer: Medicare Other | Admitting: Gastroenterology

## 2021-05-08 ENCOUNTER — Encounter: Payer: Self-pay | Admitting: Family

## 2021-05-08 ENCOUNTER — Other Ambulatory Visit: Payer: Self-pay

## 2021-05-08 ENCOUNTER — Ambulatory Visit (INDEPENDENT_AMBULATORY_CARE_PROVIDER_SITE_OTHER): Payer: Medicare Other | Admitting: Family

## 2021-05-08 ENCOUNTER — Ambulatory Visit: Payer: Medicare Other | Admitting: Family Medicine

## 2021-05-08 VITALS — BP 122/80 | HR 87 | Temp 97.5°F | Resp 16 | Ht 65.5 in | Wt 151.2 lb

## 2021-05-08 DIAGNOSIS — Z794 Long term (current) use of insulin: Secondary | ICD-10-CM

## 2021-05-08 DIAGNOSIS — E1165 Type 2 diabetes mellitus with hyperglycemia: Secondary | ICD-10-CM

## 2021-05-08 DIAGNOSIS — R69 Illness, unspecified: Secondary | ICD-10-CM | POA: Diagnosis not present

## 2021-05-08 LAB — GLUCOSE, POCT (MANUAL RESULT ENTRY): POC Glucose: 132 mg/dl — AB (ref 70–99)

## 2021-05-08 MED ORDER — LANTUS SOLOSTAR 100 UNIT/ML ~~LOC~~ SOPN: 20.0000 [IU] | PEN_INJECTOR | Freq: Every evening | SUBCUTANEOUS | 0 refills | Status: DC

## 2021-05-08 NOTE — Progress Notes (Signed)
Provider: Emie Sommerfeld FNP-C  Yvonna Alanis, NP  Patient Care Team: Yvonna Alanis, NP as PCP - General (Adult Health Nurse Practitioner)  Extended Emergency Contact Information Primary Emergency Contact: Russell,Sharon Address: [address]          [place], [place] [address] [place] Phone: [phone] Relation: Daughter Preferred language: English Interpreter needed? No Secondary Emergency Contact: Good,Amanda Address: [address]          [place], [place] [address] [place] Phone: [phone] Mobile Phone: [phone] Relation: Daughter  Code Status:  Full Code Goals of care: Advanced Directive information Advanced Directives 05/08/2021  Does Patient Have a Medical Advance Directive? No  Would patient like information on creating a medical advance directive? No - Patient declined     Chief Complaint  Patient presents with   Acute Visit    Patient wants to discuss insulin adjustments.     HPI:  Pt is a 71 y.o. female seen today for an acute visit to discuss insulin.states had trouble with her insulin pen.sometimes tries to give her self insulin but was not able to.CMA evaluated Insulin pen and was empty.Brought in three pens this visit.  CBG today 132  States saw Ophthalmology oct - Nov 2022. No record for review.  Has gained weight previous wt 114 lbs but wt today 151.2 lbs states exercise by doing her chores in the house.states can start exercising by walking around the neighborhood.    Past Medical History:  Diagnosis Date   Arthritis    Cataract 2022   bilateral sx   Diabetes mellitus without complication ([place])    on meds   Hyperlipidemia    on meds   Hypertension    on meds   Past Surgical History:  Procedure Laterality Date   HAND SURGERY Left 1972   TOTAL KNEE ARTHROPLASTY Left 2012   TOTAL KNEE ARTHROPLASTY Right 2017    Allergies  Allergen Reactions   Codeine Rash   Penicillins Rash    Has patient had a  PCN reaction causing immediate rash, facial/tongue/throat swelling, SOB or lightheadedness with hypotension: Yes Has patient had a PCN reaction causing severe rash involving mucus membranes or skin necrosis: No Has patient had a PCN reaction that required hospitalization: No Has patient had a PCN reaction occurring within the last 10 years: No If all of the above answers are "NO", then may proceed with Cephalosporin use.     Outpatient Encounter Medications as of 05/08/2021  Medication Sig   Acetaminophen (TYLENOL EXTRA STRENGTH PO) Take 1 tablet by mouth as needed.   aspirin 81 MG EC tablet Take 1 tablet (81 mg total) by mouth daily. Swallow whole.   B-D UF III MINI PEN NEEDLES 31G X 5 MM MISC Inject 1 Device into the skin 4 (four) times daily.   blood glucose meter kit and supplies Dispense based on patient and insurance preference. Use up to four times daily as directed. (FOR ICD-10 E10.9, E11.9).   glucose blood test strip 1 each by Other route as needed for other. Use as instructed   Lancets (ACCU-CHEK MULTICLIX) lancets 1 each by Other route as needed for other. Use as instructed   lisinopril (ZESTRIL) 5 MG tablet Take 1 tablet (5 mg total) by mouth daily.   rosuvastatin (CRESTOR) 5 MG tablet Take 1 tablet (5 mg total) by mouth daily.   [DISCONTINUED] LANTUS SOLOSTAR 100 UNIT/ML Solostar Pen Inject 0.2 mLs into the skin  at bedtime.   LANTUS SOLOSTAR 100 UNIT/ML Solostar Pen Inject 20 Units into the skin at bedtime.   [DISCONTINUED] insulin glargine (SEMGLEE) 100 UNIT/ML injection Inject 0.2 mLs (20 Units total) into the skin at bedtime.   No facility-administered encounter medications on file as of 05/08/2021.    Review of Systems  Constitutional:  Negative for appetite change, chills, fatigue, fever and unexpected weight change.  HENT:  Negative for congestion, dental problem, ear discharge, ear pain, facial swelling, hearing loss, nosebleeds, postnasal drip, rhinorrhea, sinus  pressure, sinus pain, sneezing, sore throat, tinnitus and trouble swallowing.   Eyes:  Negative for pain, discharge, redness, itching and visual disturbance.  Respiratory:  Negative for cough, chest tightness, shortness of breath and wheezing.   Cardiovascular:  Negative for chest pain, palpitations and leg swelling.  Gastrointestinal:  Negative for abdominal distention, abdominal pain, blood in stool, constipation, diarrhea, nausea and vomiting.  Endocrine: Negative for cold intolerance, heat intolerance, polydipsia, polyphagia and polyuria.  Genitourinary:  Negative for difficulty urinating, dysuria, flank pain, frequency and urgency.  Musculoskeletal:  Negative for arthralgias, back pain, gait problem, joint swelling, myalgias, neck pain and neck stiffness.  Skin:  Negative for color change, pallor, rash and wound.  Neurological:  Negative for dizziness, syncope, speech difficulty, weakness, light-headedness, numbness and headaches.  Hematological:  Does not bruise/bleed easily.  Psychiatric/Behavioral:  Negative for agitation, behavioral problems, confusion, hallucinations, self-injury, sleep disturbance and suicidal ideas. The patient is not nervous/anxious.    Immunization History  Administered Date(s) Administered   Tdap 05/26/2016   Pertinent  Health Maintenance Due  Topic Date Due   FOOT EXAM  Never done   OPHTHALMOLOGY EXAM  Never done   COLONOSCOPY (Pts 45-70yr Insurance coverage will need to be confirmed)  Never done   DEXA SCAN  Never done   MAMMOGRAM  02/01/2021   INFLUENZA VACCINE  07/13/2021 (Originally 11/13/2020)   HEMOGLOBIN A1C  09/24/2021   Fall Risk 10/24/2020 11/26/2020 02/23/2021 04/12/2021 05/08/2021  Falls in the past year? - - 0 0 0  Number of falls in past year - - - - -  Was there an injury with Fall? - - 0 0 0  Was there an injury with Fall? - - - - -  Fall Risk Category Calculator - - 0 0 0  Fall Risk Category - - Low Low Low  Patient Fall Risk Level Low  fall risk Low fall risk Low fall risk Low fall risk Low fall risk  Patient at Risk for Falls Due to - - No Fall Risks No Fall Risks No Fall Risks  Fall risk Follow up - - Falls evaluation completed Falls evaluation completed;Education provided;Falls prevention discussed Falls evaluation completed   Functional Status Survey:    Vitals:   05/08/21 1535  BP: 122/80  Pulse: 87  Resp: 16  Temp: (!) 97.5 F (36.4 C)  SpO2: 97%  Weight: 151 lb 3.2 oz (68.6 kg)  Height: 5' 5.5" (1.664 m)   Body mass index is 24.78 kg/m. Physical Exam Vitals reviewed.  Constitutional:      General: She is not in acute distress.    Appearance: Normal appearance. She is normal weight. She is not ill-appearing or diaphoretic.  HENT:     Head: Normocephalic.     Nose: Nose normal. No congestion or rhinorrhea.     Mouth/Throat:     Mouth: Mucous membranes are moist.     Pharynx: Oropharynx is clear. No oropharyngeal exudate or posterior oropharyngeal  erythema.  Eyes:     General: No scleral icterus.       Right eye: No discharge.        Left eye: No discharge.     Conjunctiva/sclera: Conjunctivae normal.     Pupils: Pupils are equal, round, and reactive to light.  Neck:     Vascular: No carotid bruit.  Cardiovascular:     Rate and Rhythm: Normal rate and regular rhythm.     Pulses: Normal pulses.     Heart sounds: Normal heart sounds. No murmur heard.   No friction rub. No gallop.  Pulmonary:     Effort: Pulmonary effort is normal. No respiratory distress.     Breath sounds: Normal breath sounds. No wheezing, rhonchi or rales.  Chest:     Chest wall: No tenderness.  Abdominal:     General: Bowel sounds are normal. There is no distension.     Palpations: Abdomen is soft. There is no mass.     Tenderness: There is no abdominal tenderness. There is no right CVA tenderness, left CVA tenderness, guarding or rebound.  Musculoskeletal:        General: No swelling or tenderness. Normal range of  motion.     Cervical back: Normal range of motion. No rigidity or tenderness.     Right lower leg: No edema.     Left lower leg: No edema.  Lymphadenopathy:     Cervical: No cervical adenopathy.  Skin:    General: Skin is warm and dry.     Coloration: Skin is not pale.     Findings: No bruising, erythema, lesion or rash.  Neurological:     Mental Status: She is alert and oriented to person, place, and time.     Cranial Nerves: No cranial nerve deficit.     Sensory: No sensory deficit.     Motor: No weakness.     Coordination: Coordination normal.     Gait: Gait normal.  Psychiatric:        Mood and Affect: Mood normal.        Speech: Speech normal.        Behavior: Behavior normal.    Labs reviewed: Recent Labs    03/26/21 1040  NA 130*  K 4.0  CL 98  CO2 21  GLUCOSE 435*  BUN 12  CREATININE 0.63  CALCIUM 9.6   Recent Labs    03/26/21 1040  AST 10  ALT 8  BILITOT 0.5  PROT 7.1   Recent Labs    03/26/21 1040  WBC 6.4  NEUTROABS 3,174  HGB 16.0*  HCT 46.1*  MCV 90.4  PLT 219   No results found for: TSH Lab Results  Component Value Date   HGBA1C >14.0 (H) 03/26/2021   Lab Results  Component Value Date   CHOL 183 03/26/2021   HDL 54 03/26/2021   LDLCALC 109 (H) 03/26/2021   TRIG 103 03/26/2021   CHOLHDL 3.4 03/26/2021    Significant Diagnostic Results in last 30 days:  No results found.  Assessment/Plan 1. Type 2 diabetes mellitus with hyperglycemia, with long-term current use of insulin (HCC) Lab Results  Component Value Date   HGBA1C >14.0 (H) 03/26/2021   CBG was 132 today during visit  Seems to have been confused using her Lantus pen injection per CMA patient was trying to adjust pen to read 5 units but lantus pen was empty.verified pen during visit and she discard in the sharp container.Other tow pen that  she brought were filled.Patient demonstrate back how to adjust per to whatever insulin she needs to inject at bed time. Continue to  monitor.  - up to date with annual eye exam  - Refer to Podiatrist for annual foot exam   - POC Glucose (CBG) - LANTUS SOLOSTAR 100 UNIT/ML Solostar Pen; Inject 20 Units into the skin at bedtime.  Dispense: 18 mL; Refill: 0  Family/ staff Communication: Reviewed plan of care with patient verbalized understanding.   Labs/tests ordered: None   Next Appointment: As needed if symptoms worsen or fail to improve    Sandrea Hughs, NP

## 2021-05-08 NOTE — Patient Instructions (Signed)

## 2021-05-10 NOTE — Patient Instructions (Signed)
No show- disregard

## 2021-05-23 ENCOUNTER — Ambulatory Visit: Payer: Medicare Other | Admitting: Podiatry

## 2021-05-24 ENCOUNTER — Encounter: Payer: Medicare Other | Admitting: Gastroenterology

## 2021-05-24 ENCOUNTER — Encounter: Payer: Self-pay | Admitting: Gastroenterology

## 2021-05-25 ENCOUNTER — Telehealth: Payer: Self-pay | Admitting: Orthopedic Surgery

## 2021-05-25 NOTE — Telephone Encounter (Signed)
Thank you  for update

## 2021-05-25 NOTE — Telephone Encounter (Signed)
Pt left vm on referral line stating she coughed all night & her back hurts.  Spoke with pt, she refused an appt(virtual or in office on 2/10) bc she felt that laying in bed & taking tylenol may help help today.  Ms Binz stated that if she didn't feel better over the weekend, she would call back Monday for appt.  Thanks, Misty Stanley

## 2021-06-06 ENCOUNTER — Encounter: Payer: Self-pay | Admitting: Orthopedic Surgery

## 2021-06-07 ENCOUNTER — Ambulatory Visit (INDEPENDENT_AMBULATORY_CARE_PROVIDER_SITE_OTHER): Payer: Medicare Other | Admitting: Orthopedic Surgery

## 2021-06-07 ENCOUNTER — Other Ambulatory Visit: Payer: Self-pay

## 2021-06-07 VITALS — BP 126/76 | HR 86 | Temp 98.4°F | Ht 65.0 in | Wt 147.2 lb

## 2021-06-07 DIAGNOSIS — Z794 Long term (current) use of insulin: Secondary | ICD-10-CM | POA: Diagnosis not present

## 2021-06-07 DIAGNOSIS — Z6824 Body mass index (BMI) 24.0-24.9, adult: Secondary | ICD-10-CM | POA: Diagnosis not present

## 2021-06-07 DIAGNOSIS — Z780 Asymptomatic menopausal state: Secondary | ICD-10-CM

## 2021-06-07 DIAGNOSIS — Z1211 Encounter for screening for malignant neoplasm of colon: Secondary | ICD-10-CM

## 2021-06-07 DIAGNOSIS — E1165 Type 2 diabetes mellitus with hyperglycemia: Secondary | ICD-10-CM | POA: Diagnosis not present

## 2021-06-07 DIAGNOSIS — Z131 Encounter for screening for diabetes mellitus: Secondary | ICD-10-CM | POA: Diagnosis not present

## 2021-06-07 DIAGNOSIS — Z1159 Encounter for screening for other viral diseases: Secondary | ICD-10-CM

## 2021-06-07 DIAGNOSIS — Z1231 Encounter for screening mammogram for malignant neoplasm of breast: Secondary | ICD-10-CM

## 2021-06-07 NOTE — Progress Notes (Signed)
Careteam: Patient Care Team: Yvonna Alanis, NP as PCP - General (Adult Health Nurse Practitioner)  Seen by: Windell Moulding, AGNP-C  PLACE OF SERVICE:  [place] Directive information Does Patient Have a Medical Advance Directive?: No, Would patient like information on creating a medical advance directive?: No - Patient declined  Allergies  Allergen Reactions   Codeine Rash   Penicillins Rash    Has patient had a PCN reaction causing immediate rash, facial/tongue/throat swelling, SOB or lightheadedness with hypotension: Yes Has patient had a PCN reaction causing severe rash involving mucus membranes or skin necrosis: No Has patient had a PCN reaction that required hospitalization: No Has patient had a PCN reaction occurring within the last 10 years: No If all of the above answers are "NO", then may proceed with Cephalosporin use.     Chief Complaint  Patient presents with   Medical Management of Chronic Issues    2 month follow up.  Discuss the need for foot and eye exam, colonoscopy, Hep C screening, Shingrix, Pneumonia, and COVID vaccine, or post pone if patient refuses.      HPI: Patient is a 71 y.o. female seen today for medical management of chronic conditions.   No health concerns today.   Continues to check sugars daily, averaging 100-150 in the morning. She is taking Lantus 20 units at night. Denies hypoglycemic events. She reports limiting breads and sugars in diet. She bought a Information systems manager and has been cooking more vegetables in it.   Dr. Darleen Crocker is her eye doctor. Discussed need for diabetic eye exam.    She has been talking with [facility] to schedule colonoscopy. Trying to schedule and have sister take her. Denies blood in stool or abdominal pain.   Bone density scheduled 07/30/2021. She plans to schedule mammogram in future.   Discussed pneumonia, shingles and covid booster vaccines- she states " I will never taken them, they will make me sick."  Education given, she refuses at this time.      Review of Systems:  Review of Systems  Constitutional: Negative.  Negative for fever.  HENT: Negative.    Eyes: Negative.   Respiratory:  Negative for cough, shortness of breath and wheezing.   Cardiovascular:  Negative for chest pain and leg swelling.  Gastrointestinal: Negative.   Genitourinary: Negative.   Musculoskeletal: Negative.   Skin: Negative.   Neurological: Negative.   Endo/Heme/Allergies:  Negative for polydipsia.  Psychiatric/Behavioral: Negative.     Past Medical History:  Diagnosis Date   Arthritis    Cataract 2022   bilateral sx   Diabetes mellitus without complication ([place])    on meds   Hyperlipidemia    on meds   Hypertension    on meds   Past Surgical History:  Procedure Laterality Date   HAND SURGERY Left 1972   TOTAL KNEE ARTHROPLASTY Left 2012   TOTAL KNEE ARTHROPLASTY Right 2017   Social History:   reports that she quit smoking about 2 years ago. Her smoking use included cigarettes. She has a 30.00 pack-year smoking history. She has never used smokeless tobacco. She reports that she does not drink alcohol and does not use drugs.  Family History  Problem Relation Age of Onset   Cancer Mother    Prostate cancer Father    Cancer Father    Colon polyps Neg Hx    Colon cancer Neg Hx    Esophageal cancer Neg Hx    Rectal  cancer Neg Hx    Stomach cancer Neg Hx     Medications: Patient's Medications  New Prescriptions   No medications on file  Previous Medications   ACETAMINOPHEN (TYLENOL EXTRA STRENGTH PO)    Take 1 tablet by mouth as needed.   ASPIRIN 81 MG EC TABLET    Take 1 tablet (81 mg total) by mouth daily. Swallow whole.   B-D UF III MINI PEN NEEDLES 31G X 5 MM MISC    Inject 1 Device into the skin 4 (four) times daily.   BLOOD GLUCOSE METER KIT AND SUPPLIES    Dispense based on patient and insurance preference. Use up to four times daily as directed. (FOR ICD-10 E10.9, E11.9).    GLUCOSE BLOOD TEST STRIP    1 each by Other route as needed for other. Use as instructed   LANCETS (ACCU-CHEK MULTICLIX) LANCETS    1 each by Other route as needed for other. Use as instructed   LANTUS SOLOSTAR 100 UNIT/ML SOLOSTAR PEN    Inject 20 Units into the skin at bedtime.   LISINOPRIL (ZESTRIL) 5 MG TABLET    Take 1 tablet (5 mg total) by mouth daily.   ROSUVASTATIN (CRESTOR) 5 MG TABLET    Take 1 tablet (5 mg total) by mouth daily.  Modified Medications   No medications on file  Discontinued Medications   No medications on file    Physical Exam:  Vitals:   06/07/21 1123  BP: 126/76  Pulse: 86  Temp: 98.4 F (36.9 C)  SpO2: 95%  Weight: 147 lb 3.2 oz (66.8 kg)  Height: '5\' 5"'  (1.651 m)   Body mass index is 24.5 kg/m. Wt Readings from Last 3 Encounters:  06/07/21 147 lb 3.2 oz (66.8 kg)  05/08/21 151 lb 3.2 oz (68.6 kg)  04/20/21 114 lb (51.7 kg)    Physical Exam Vitals reviewed.  Constitutional:      General: She is not in acute distress. HENT:     Head: Normocephalic.  Eyes:     General:        Right eye: No discharge.        Left eye: No discharge.  Cardiovascular:     Rate and Rhythm: Normal rate and regular rhythm.     Pulses:          Dorsalis pedis pulses are 1+ on the right side and 1+ on the left side.     Heart sounds: Normal heart sounds. No murmur heard. Pulmonary:     Effort: Pulmonary effort is normal. No respiratory distress.     Breath sounds: Normal breath sounds. No wheezing.  Abdominal:     General: Bowel sounds are normal. There is no distension.     Palpations: Abdomen is soft.     Tenderness: There is no abdominal tenderness.  Musculoskeletal:     Cervical back: Neck supple.     Right lower leg: No edema.     Left lower leg: No edema.  Feet:     Right foot:     Protective Sensation: 10 sites tested.  10 sites sensed.     Skin integrity: Skin integrity normal.     Toenail Condition: Right toenails are abnormally thick.     Left  foot:     Protective Sensation: 10 sites tested.  10 sites sensed.     Skin integrity: Skin integrity normal.     Toenail Condition: Left toenails are abnormally thick.  Skin:  General: Skin is warm and dry.     Capillary Refill: Capillary refill takes less than 2 seconds.  Neurological:     General: No focal deficit present.     Mental Status: She is alert and oriented to person, place, and time.     Motor: No weakness.     Gait: Gait normal.  Psychiatric:        Mood and Affect: Mood normal.        Behavior: Behavior normal.    Labs reviewed: Basic Metabolic Panel: Recent Labs    03/26/21 1040  NA 130*  K 4.0  CL 98  CO2 21  GLUCOSE 435*  BUN 12  CREATININE 0.63  CALCIUM 9.6   Liver Function Tests: Recent Labs    03/26/21 1040  AST 10  ALT 8  BILITOT 0.5  PROT 7.1   No results for input(s): LIPASE, AMYLASE in the last 8760 hours. No results for input(s): AMMONIA in the last 8760 hours. CBC: Recent Labs    03/26/21 1040  WBC 6.4  NEUTROABS 3,174  HGB 16.0*  HCT 46.1*  MCV 90.4  PLT 219   Lipid Panel: Recent Labs    03/26/21 1040  CHOL 183  HDL 54  LDLCALC 109*  TRIG 103  CHOLHDL 3.4   TSH: No results for input(s): TSH in the last 8760 hours. A1C: Lab Results  Component Value Date   HGBA1C >14.0 (H) 03/26/2021     Assessment/Plan 1. Type 2 diabetes mellitus with hyperglycemia, with long-term current use of insulin (HCC) - A1c > 14 03/26/2021 - AM sugars averaging 100-150, no hypoglycemic events - cont Lantus 20 units daily - cont asa, ACE and statin - diabetic foot exam today - discussed diabetic eye exam today - cont to limit carbs and sugars in diet - CBC with Differential/Platelet - CMP - Hemoglobin A1c  2. Need for hepatitis C screening test - Hep C Antibody  3. Postmenopausal - Bone density scheduled for 07/30/2021  4. Screening mammogram for breast cancer - she plans to schedule in future  5. Screening for colon  cancer - working with [facility] to schedule - she is trying to have family help with transportation  6. BMI 24.0-24.9, adult - she is down 4 lbs from last encounter - recommend exercise 150 min/week - cont to limit calories to < 1500/day  Total time: 32 minutes. Greater than 50% pf total time spent doing patient educations regarding health maintenance- including vaccinations/bone density/mammogram/colonoscopy, diabetes management also discussed.    Next appt: 10/04/2021  Windell Moulding, [facility] [phone]

## 2021-06-07 NOTE — Patient Instructions (Addendum)
Please schedule diabetic eye exam with your eye doctor  [facility] - [address], [place], [place] [address]- [phone]

## 2021-06-08 LAB — CBC WITH DIFFERENTIAL/PLATELET
Absolute Monocytes: 490 cells/uL (ref 200–950)
Basophils Absolute: 29 cells/uL (ref 0–200)
Basophils Relative: 0.5 %
Eosinophils Absolute: 182 cells/uL (ref 15–500)
Eosinophils Relative: 3.2 %
HCT: 42.1 % (ref 35.0–45.0)
Hemoglobin: 13.5 g/dL (ref 11.7–15.5)
Lymphs Abs: 2993 cells/uL (ref 850–3900)
MCH: 29 pg (ref 27.0–33.0)
MCHC: 32.1 g/dL (ref 32.0–36.0)
MCV: 90.5 fL (ref 80.0–100.0)
MPV: 12 fL (ref 7.5–12.5)
Monocytes Relative: 8.6 %
Neutro Abs: 2006 cells/uL (ref 1500–7800)
Neutrophils Relative %: 35.2 %
Platelets: 334 10*3/uL (ref 140–400)
RBC: 4.65 10*6/uL (ref 3.80–5.10)
RDW: 12.8 % (ref 11.0–15.0)
Total Lymphocyte: 52.5 %
WBC: 5.7 10*3/uL (ref 3.8–10.8)

## 2021-06-08 LAB — HEPATITIS C ANTIBODY
Hepatitis C Ab: NONREACTIVE
SIGNAL TO CUT-OFF: <0.02 (ref <1.00)

## 2021-06-08 LAB — COMPREHENSIVE METABOLIC PANEL
AG Ratio: 1.3 (calc) (ref 1.0–2.5)
ALT: 8 U/L (ref 6–29)
AST: 17 U/L (ref 10–35)
Albumin: 3.9 g/dL (ref 3.6–5.1)
Alkaline phosphatase (APISO): 68 U/L (ref 37–153)
BUN/Creatinine Ratio: 18 (calc) (ref 6–22)
BUN: 9 mg/dL (ref 7–25)
CO2: 25 mmol/L (ref 20–32)
Calcium: 9.3 mg/dL (ref 8.6–10.4)
Chloride: 107 mmol/L (ref 98–110)
Creat: 0.49 mg/dL — ABNORMAL LOW (ref 0.50–1.05)
Globulin: 2.9 g/dL (calc) (ref 1.9–3.7)
Glucose, Bld: 109 mg/dL (ref 65–139)
Potassium: 4.8 mmol/L (ref 3.5–5.3)
Sodium: 139 mmol/L (ref 135–146)
Total Bilirubin: 0.5 mg/dL (ref 0.2–1.2)
Total Protein: 6.8 g/dL (ref 6.1–8.1)

## 2021-06-08 LAB — HEMOGLOBIN A1C
Hgb A1c MFr Bld: 9.5 % of total Hgb — ABNORMAL HIGH (ref <5.7)
Mean Plasma Glucose: 226 mg/dL
eAG (mmol/L): 12.5 mmol/L

## 2021-06-11 ENCOUNTER — Ambulatory Visit (INDEPENDENT_AMBULATORY_CARE_PROVIDER_SITE_OTHER): Payer: Medicare Other | Admitting: Podiatry

## 2021-06-11 ENCOUNTER — Other Ambulatory Visit: Payer: Self-pay

## 2021-06-11 DIAGNOSIS — L84 Corns and callosities: Secondary | ICD-10-CM

## 2021-06-11 DIAGNOSIS — Z794 Long term (current) use of insulin: Secondary | ICD-10-CM

## 2021-06-11 DIAGNOSIS — M79675 Pain in left toe(s): Secondary | ICD-10-CM

## 2021-06-11 DIAGNOSIS — M79674 Pain in right toe(s): Secondary | ICD-10-CM | POA: Diagnosis not present

## 2021-06-11 DIAGNOSIS — B351 Tinea unguium: Secondary | ICD-10-CM | POA: Diagnosis not present

## 2021-06-11 DIAGNOSIS — E119 Type 2 diabetes mellitus without complications: Secondary | ICD-10-CM

## 2021-06-17 ENCOUNTER — Encounter: Payer: Self-pay | Admitting: Podiatry

## 2021-06-17 NOTE — Progress Notes (Signed)
ANNUAL DIABETIC FOOT EXAM  Subjective: Christyna Letendre presents today for annual diabetic foot examination.  Patient relates dx of diabetes.  Patient denies any h/o foot wounds.  Patient admits to getting pedicure performed several months ago.  Patient denies any numbness, tingling, burning, or pins/needle sensation in feet.  Patient's blood sugar was 112 mg/dl today.   Patient did not check blood glucose this morning.  Patient does not monitor blood glucose daily.  Risk factors: diabetes, HTN, ESRD on hemodialysis, h/o tobacco use in remission.  Yvonna Alanis, NP is patient's PCP. Last visit was June 07, 2021.  Past Medical History:  Diagnosis Date   Arthritis    Cataract 2022   bilateral sx   Diabetes mellitus without complication ([place])    on meds   Hyperlipidemia    on meds   Hypertension    on meds   Patient Active Problem List   Diagnosis Date Noted   Diabetes mellitus type 2, insulin dependent ([place]) 04/12/2021   Neck pain, acute 02/23/2021   Postmenopausal 02/23/2021   Left shoulder pain 01/11/2015   Past Surgical History:  Procedure Laterality Date   HAND SURGERY Left 1972   TOTAL KNEE ARTHROPLASTY Left 2012   TOTAL KNEE ARTHROPLASTY Right 2017   Current Outpatient Medications on File Prior to Visit  Medication Sig Dispense Refill   Acetaminophen (TYLENOL EXTRA STRENGTH PO) Take 1 tablet by mouth as needed.     aspirin 81 MG EC tablet Take 1 tablet (81 mg total) by mouth daily. Swallow whole. 30 tablet 12   B-D UF III MINI PEN NEEDLES 31G X 5 MM MISC Inject 1 Device into the skin 4 (four) times daily.     blood glucose meter kit and supplies Dispense based on patient and insurance preference. Use up to four times daily as directed. (FOR ICD-10 E10.9, E11.9). 1 each 0   glucose blood test strip 1 each by Other route as needed for other. Use as instructed 100 each 2   Lancets (ACCU-CHEK MULTICLIX) lancets 1 each by Other route as needed for other. Use as  instructed 100 each 2   LANTUS SOLOSTAR 100 UNIT/ML Solostar Pen Inject 20 Units into the skin at bedtime. 18 mL 0   lisinopril (ZESTRIL) 5 MG tablet Take 1 tablet (5 mg total) by mouth daily. 90 tablet 3   rosuvastatin (CRESTOR) 5 MG tablet Take 1 tablet (5 mg total) by mouth daily. 90 tablet 3   No current facility-administered medications on file prior to visit.    Allergies  Allergen Reactions   Codeine Rash   Penicillins Rash    Has patient had a PCN reaction causing immediate rash, facial/tongue/throat swelling, SOB or lightheadedness with hypotension: Yes Has patient had a PCN reaction causing severe rash involving mucus membranes or skin necrosis: No Has patient had a PCN reaction that required hospitalization: No Has patient had a PCN reaction occurring within the last 10 years: No If all of the above answers are "NO", then may proceed with Cephalosporin use.    Social History   Occupational History   Not on file  Tobacco Use   Smoking status: Former    Packs/day: 1.00    Years: 30.00    Pack years: 30.00    Types: Cigarettes    Quit date: 2021    Years since quitting: 2.1   Smokeless tobacco: Never  Vaping Use   Vaping Use: Never used  Substance and Sexual Activity   Alcohol  use: No   Drug use: No   Sexual activity: Not on file   Family History  Problem Relation Age of Onset   Cancer Mother    Prostate cancer Father    Cancer Father    Colon polyps Neg Hx    Colon cancer Neg Hx    Esophageal cancer Neg Hx    Rectal cancer Neg Hx    Stomach cancer Neg Hx    Immunization History  Administered Date(s) Administered   Tdap 05/26/2016     Review of Systems: Negative except as noted in the HPI.   Objective: There were no vitals filed for this visit.  Berdie Brindle is a pleasant 71 y.o. female in NAD. AAO X 3.  Vascular Examination: Capillary refill time to digits immediate b/l. Palpable pedal pulses b/l LE. Pedal hair present. No pain with calf  compression b/l. Lower extremity skin temperature gradient within normal limits. No edema noted b/l LE. No cyanosis or clubbing noted b/l LE.  Dermatological Examination: Pedal integument with normal turgor, texture and tone b/l LE. No open wounds b/l. No interdigital macerations b/l. Toenails 1-5 b/l elongated, thickened, discolored with subungual debris. +Tenderness with dorsal palpation of nailplates. Hyperkeratotic lesion(s) noted submet head 5 left foot.  Neurological Examination: Protective sensation intact 5/5 intact bilaterally with 10g monofilament b/l. Vibratory sensation intact b/l. Proprioception intact bilaterally.  Musculoskeletal Examination: Normal muscle strength 5/5 to all lower extremity muscle groups bilaterally. No pain, crepitus or joint limitation noted with ROM b/l LE. No gross bony pedal deformities b/l. Patient ambulates independently without assistive aids.  Footwear Assessment: Does the patient wear appropriate shoes? Yes. Does the patient need inserts/orthotics? No.  Hemoglobin A1C Latest Ref Rng & Units 06/07/2021 03/26/2021  HGBA1C <5.7 % of total Hgb 9.5(H) >14.0(H)  Some recent data might be hidden   Assessment: 1. Pain due to onychomycosis of toenails of both feet   2. Callus   3. Diabetes mellitus type 2, insulin dependent ([place])   4. Encounter for diabetic foot exam ([place])     ADA Risk Categorization: Low Risk :  Patient has all of the following: Intact protective sensation No prior foot ulcer  No severe deformity Pedal pulses present  Plan: -Diabetic foot examination performed today. -Continue foot and shoe inspections daily. Monitor blood glucose per PCP/Endocrinologist's recommendations. -Toenails 1-5 b/l were debrided in length and girth with sterile nail nippers and dremel without iatrogenic bleeding.  -Callus(es) submet head 5 left foot pared utilizing mandrel sander without complication or incident. Total number debrided =1. -Patient/POA  to call should there be question/concern in the interim. Return in about 3 months (around 09/08/2021).  Marzetta Board, DPM

## 2021-06-18 ENCOUNTER — Telehealth: Payer: Self-pay | Admitting: *Deleted

## 2021-06-18 NOTE — Telephone Encounter (Signed)
Patient called and stated that she is having Sinus Congestion and stated that Amy told her that her body would just need to get use to it.  ?Patient stated that we are telling her the wrong Dx because insurance it paying for everything and she knows insurance is high and she knows our Paychecks look good.  ? ?Tried to offer her an appointment and she stated that she is going to find her another Dr. Claiborne Billings will check her out good and Hung up on me.  ?

## 2021-06-18 NOTE — Telephone Encounter (Signed)
Patient refused appointment and hung up on me.  ?

## 2021-06-18 NOTE — Telephone Encounter (Signed)
Thank you  for update

## 2021-06-18 NOTE — Telephone Encounter (Signed)
Thank you for update. Diabetes was discussed last encounter. I was not aware she was having sinus congestion. She depends on transportation for appointments. Can we try to offer her a telephone visit?

## 2021-07-02 ENCOUNTER — Ambulatory Visit (AMBULATORY_SURGERY_CENTER): Payer: Self-pay

## 2021-07-02 ENCOUNTER — Other Ambulatory Visit: Payer: Self-pay

## 2021-07-02 VITALS — Ht 65.5 in | Wt 143.0 lb

## 2021-07-02 DIAGNOSIS — Z1211 Encounter for screening for malignant neoplasm of colon: Secondary | ICD-10-CM

## 2021-07-02 MED ORDER — NA SULFATE-K SULFATE-MG SULF 17.5-3.13-1.6 GM/177ML PO SOLN: 1.0000 | Freq: Once | ORAL | 0 refills | Status: AC

## 2021-07-02 NOTE — Progress Notes (Signed)
Denies allergies to eggs or soy products. Denies complication of anesthesia or sedation. Denies use of weight loss medication. Denies use of O2.   Emmi instructions given for colonoscopy.  

## 2021-07-15 ENCOUNTER — Encounter: Payer: Self-pay | Admitting: Certified Registered Nurse Anesthetist

## 2021-07-16 ENCOUNTER — Ambulatory Visit (AMBULATORY_SURGERY_CENTER): Payer: Medicare Other | Admitting: Gastroenterology

## 2021-07-16 ENCOUNTER — Encounter: Payer: Self-pay | Admitting: Gastroenterology

## 2021-07-16 VITALS — BP 104/50 | HR 69 | Temp 96.9°F | Resp 11

## 2021-07-16 DIAGNOSIS — Z538 Procedure and treatment not carried out for other reasons: Secondary | ICD-10-CM

## 2021-07-16 DIAGNOSIS — Z1211 Encounter for screening for malignant neoplasm of colon: Secondary | ICD-10-CM | POA: Diagnosis not present

## 2021-07-16 MED ORDER — SODIUM CHLORIDE 0.9 % IV SOLN: 500.0000 mL | Freq: Once | INTRAVENOUS | Status: DC

## 2021-07-16 NOTE — Patient Instructions (Signed)
Procedure and pre procedure phone call rescheduled.  ? ?YOU HAD AN ENDOSCOPIC PROCEDURE TODAY AT THE [facility]:   Refer to the procedure report that was given to you for any specific questions about what was found during the examination.  If the procedure report does not answer your questions, please call your gastroenterologist to clarify.  If you requested that your care partner not be given the details of your procedure findings, then the procedure report has been included in a sealed envelope for you to review at your convenience later. ? ?YOU SHOULD EXPECT: Some feelings of bloating in the abdomen. Passage of more gas than usual.  Walking can help get rid of the air that was put into your GI tract during the procedure and reduce the bloating. If you had a lower endoscopy (such as a colonoscopy or flexible sigmoidoscopy) you may notice spotting of blood in your stool or on the toilet paper. If you underwent a bowel prep for your procedure, you may not have a normal bowel movement for a few days. ? ?Please Note:  You might notice some irritation and congestion in your nose or some drainage.  This is from the oxygen used during your procedure.  There is no need for concern and it should clear up in a day or so. ? ?SYMPTOMS TO REPORT IMMEDIATELY: ? ?Following lower endoscopy (colonoscopy or flexible sigmoidoscopy): ? Excessive amounts of blood in the stool ? Significant tenderness or worsening of abdominal pains ? Swelling of the abdomen that is new, acute ? Fever of 100?F or higher ? ? ?For urgent or emergent issues, a gastroenterologist can be reached at any hour by calling [phone]. ?Do not use MyChart messaging for urgent concerns.  ? ? ?DIET:  We do recommend a small meal at first, but then you may proceed to your regular diet.  Drink plenty of fluids but you should avoid alcoholic beverages for 24 hours. ? ?ACTIVITY:  You should plan to take it easy for the rest of today and you should  NOT DRIVE or use heavy machinery until tomorrow (because of the sedation medicines used during the test).   ? ?FOLLOW UP: ?Our staff will call the number listed on your records 48-72 hours following your procedure to check on you and address any questions or concerns that you may have regarding the information given to you following your procedure. If we do not reach you, we will leave a message.  We will attempt to reach you two times.  During this call, we will ask if you have developed any symptoms of COVID 19. If you develop any symptoms (ie: fever, flu-like symptoms, shortness of breath, cough etc.) before then, please call [phone].  If you test positive for Covid 19 in the 2 weeks post procedure, please call and report this information to [place].   ? ?If any biopsies were taken you will be contacted by phone or by letter within the next 1-3 weeks.  Please call us at [phone] if you have not heard about the biopsies in 3 weeks.  ? ? ?SIGNATURES/CONFIDENTIALITY: ?You and/or your care partner have signed paperwork which will be entered into your electronic medical record.  These signatures attest to the fact that that the information above on your After Visit Summary has been reviewed and is understood.  Full responsibility of the confidentiality of this discharge information lies with you and/or your care-partner.  ?

## 2021-07-16 NOTE — Progress Notes (Signed)
Sedate, gd SR, tolerated procedure well, VSS, report to RN 

## 2021-07-16 NOTE — Op Note (Signed)
[name] ?Patient Name: [name] ?Procedure Date: 07/16/2021 1:18 PM ?MRN: [id number] ?Endoscopist: Carlota Raspberry. Havery Moros , MD ?Age: 71 ?Referring MD:  ?Date of Birth: May 30, 1950 ?Gender: Female ?Account #: [id number] ?Procedure:                Colonoscopy ?Indications:              Screening for colorectal malignant neoplasm, This  ?                          is the patient's first colonoscopy ?Medicines:                Monitored Anesthesia Care ?Procedure:                Pre-Anesthesia Assessment: ?                          - Prior to the procedure, a History and Physical  ?                          was performed, and patient medications and  ?                          allergies were reviewed. The patient's tolerance of  ?                          previous anesthesia was also reviewed. The risks  ?                          and benefits of the procedure and the sedation  ?                          options and risks were discussed with the patient.  ?                          All questions were answered, and informed consent  ?                          was obtained. Prior Anticoagulants: The patient has  ?                          taken no previous anticoagulant or antiplatelet  ?                          agents. ASA Grade Assessment: II - A patient with  ?                          mild systemic disease. After reviewing the risks  ?                          and benefits, the patient was deemed in  ?                          satisfactory condition to undergo the procedure. ?  After obtaining informed consent, the colonoscope  ?                          was passed under direct vision. Throughout the  ?                          procedure, the patient's blood pressure, pulse, and  ?                          oxygen saturations were monitored continuously. The  ?                          Olympus Scope [id number] was introduced through the  ?                          anus and advanced  to the the cecum, identified by  ?                          appendiceal orifice and ileocecal valve. The  ?                          colonoscopy was performed without difficulty. The  ?                          patient tolerated the procedure well. The quality  ?                          of the bowel preparation was inadequate. The  ?                          ileocecal valve, appendiceal orifice, and rectum  ?                          were photographed. ?Scope In: 1:22:35 PM ?Scope Out: 1:32:48 PM ?Scope Withdrawal Time: 0 hours 3 minutes 57 seconds  ?Total Procedure Duration: 0 hours 10 minutes 13 seconds  ?Findings:                 The perianal and digital rectal examinations were  ?                          normal. ?                          A large amount of semi-liquid stool was found in  ?                          the entire colon, making visualization difficult,  ?                          worst in dependant portions of the colon. Lavage of  ?                          the colon was performed using copious amounts of  ?  sterile water, resulting in incomplete clearance  ?                          with fair visualization. Unfortunately the scope  ?                          clogged multiple times during this process and the  ?                          colon could not be cleared of stool to obtain  ?                          adequate views. The prep was inadequate for  ?                          screening purposes. ?                          The exam was otherwise without abnormality. No  ?                          large mass lesions or obvious polyps seen, but prep  ?                          inadequate for screening purposes. ?Complications:            No immediate complications. Estimated blood loss:  ?                          None. ?Estimated Blood Loss:     Estimated blood loss: none. ?Impression:               - Stool in the entire examined colon - prep  ?                           inadequate for screening purposes ?                          - No obvious pathology seen otherwise of what was  ?                          visualized ?                          - No specimens collected. ?Recommendation:           - Patient has a contact number available for  ?                          emergencies. The signs and symptoms of potential  ?                          delayed complications were discussed with the  ?                          patient. Return to normal activities tomorrow.  ?  Written discharge instructions were provided to the  ?                          patient. ?                          - Resume previous diet. ?                          - Continue present medications. ?                          - Repeat colonoscopy because the bowel preparation  ?                          was suboptimal using 2 day prep, will discuss  ?                          options with the patient ?Remo Lipps P. Dreyton Roessner, MD ?07/16/2021 1:38:50 PM ?This report has been signed electronically. ?

## 2021-07-16 NOTE — Progress Notes (Signed)
[facility] History and Physical ? ? ?Primary Care Physician:  Yvonna Alanis, NP ? ? ?Reason for Procedure:   Colon cancer screening ? ?Plan:    colonoscopy ? ? ? ? ?HPI: [name] is a 71 y.o. female  here for colonoscopy screening. First time exam. Patient denies any bowel symptoms at this time. No family history of colon cancer known. Otherwise feels well without any cardiopulmonary symptoms.  ? ? ?Past Medical History:  ?Diagnosis Date  ? Arthritis   ? Cataract 2022  ? bilateral sx  ? Depression   ? Diabetes mellitus without complication ([place])   ? on meds  ? Hyperlipidemia   ? on meds  ? Hypertension   ? on meds  ? ? ?Past Surgical History:  ?Procedure Laterality Date  ? HAND SURGERY Left 1972  ? TOTAL KNEE ARTHROPLASTY Left 2012  ? TOTAL KNEE ARTHROPLASTY Right 2017  ? ? ?Prior to Admission medications   ?Medication Sig Start Date End Date Taking? Authorizing Provider  ?Acetaminophen (TYLENOL EXTRA STRENGTH PO) Take 1 tablet by mouth as needed.   Yes [provider]  ?B-D UF III MINI PEN NEEDLES 31G X 5 MM MISC Inject 1 Device into the skin 4 (four) times daily. 04/13/21  Yes [provider]  ?blood glucose meter kit and supplies Dispense based on patient and insurance preference. Use up to four times daily as directed. (FOR ICD-10 E10.9, E11.9). 03/29/21  Yes Fargo, [place], NP  ?glucose blood test strip 1 each by Other route as needed for other. Use as instructed 04/12/21  Yes Fargo, Amy E, NP  ?Lancets (ACCU-CHEK MULTICLIX) lancets 1 each by Other route as needed for other. Use as instructed 04/12/21  Yes Fargo, Amy E, NP  ?LANTUS SOLOSTAR 100 UNIT/ML Solostar Pen Inject 20 Units into the skin at bedtime. 05/08/21 08/06/21 Yes Ngetich, Nelda Bucks, NP  ?lisinopril (ZESTRIL) 5 MG tablet Take 1 tablet (5 mg total) by mouth daily. 03/28/21  Yes Yvonna Alanis, NP  ?rosuvastatin (CRESTOR) 5 MG tablet Take 1 tablet (5 mg total) by mouth daily. 03/28/21  Yes Fargo, Amy E, NP  ? ? ?Current  Outpatient Medications  ?Medication Sig Dispense Refill  ? Acetaminophen (TYLENOL EXTRA STRENGTH PO) Take 1 tablet by mouth as needed.    ? B-D UF III MINI PEN NEEDLES 31G X 5 MM MISC Inject 1 Device into the skin 4 (four) times daily.    ? blood glucose meter kit and supplies Dispense based on patient and insurance preference. Use up to four times daily as directed. (FOR ICD-10 E10.9, E11.9). 1 each 0  ? glucose blood test strip 1 each by Other route as needed for other. Use as instructed 100 each 2  ? Lancets (ACCU-CHEK MULTICLIX) lancets 1 each by Other route as needed for other. Use as instructed 100 each 2  ? LANTUS SOLOSTAR 100 UNIT/ML Solostar Pen Inject 20 Units into the skin at bedtime. 18 mL 0  ? lisinopril (ZESTRIL) 5 MG tablet Take 1 tablet (5 mg total) by mouth daily. 90 tablet 3  ? rosuvastatin (CRESTOR) 5 MG tablet Take 1 tablet (5 mg total) by mouth daily. 90 tablet 3  ? ?Current Facility-Administered Medications  ?Medication Dose Route Frequency Provider Last Rate Last Admin  ? 0.9 %  sodium chloride infusion  500 mL Intravenous Once Akin Yi, Carlota Raspberry, MD      ? ? ?Allergies as of 07/16/2021 - Review Complete 07/16/2021  ?Allergen Reaction  Noted  ? Codeine Rash 03/25/2011  ? Penicillins Rash 03/25/2011  ? ? ?Family History  ?Problem Relation Age of Onset  ? Cancer Mother   ? Prostate cancer Father   ? Cancer Father   ? Colon polyps Neg Hx   ? Colon cancer Neg Hx   ? Esophageal cancer Neg Hx   ? Rectal cancer Neg Hx   ? Stomach cancer Neg Hx   ? ? ?Social History  ? ?Socioeconomic History  ? Marital status: Single  ?  Spouse name: Not on file  ? Number of children: Not on file  ? Years of education: Not on file  ? Highest education level: Not on file  ?Occupational History  ? Not on file  ?Tobacco Use  ? Smoking status: Former  ?  Packs/day: 1.00  ?  Years: 30.00  ?  Pack years: 30.00  ?  Types: Cigarettes  ?  Quit date: 2021  ?  Years since quitting: 2.2  ? Smokeless tobacco: Never  ?Vaping Use   ? Vaping Use: Never used  ?Substance and Sexual Activity  ? Alcohol use: No  ? Drug use: No  ? Sexual activity: Not on file  ?Other Topics Concern  ? Not on file  ?Social History Narrative  ? Not on file  ? ?Social Determinants of Health  ? ?Financial Resource Strain: Not on file  ?Food Insecurity: Not on file  ?Transportation Needs: Not on file  ?Physical Activity: Not on file  ?Stress: Not on file  ?Social Connections: Not on file  ?Intimate Partner Violence: Not on file  ? ? ?Review of Systems: ?All other review of systems negative except as mentioned in the HPI. ? ?Physical Exam: ?Vital signs ?Temp (!) 96.9 ?F (36.1 ?C)  ? ?General:   Alert,  Well-developed, pleasant and cooperative in NAD ?Lungs:  Clear throughout to auscultation.   ?Heart:  Regular rate and rhythm ?Abdomen:  Soft, nontender and nondistended.   ?Neuro/Psych:  Alert and cooperative. Normal mood and affect. A and O x 3 ? ?Jolly Mango, MD ?[facility] ? ? ?

## 2021-07-16 NOTE — Progress Notes (Signed)
Pt's states no medical or surgical changes since previsit or office visit. 

## 2021-07-17 ENCOUNTER — Telehealth: Payer: Self-pay | Admitting: *Deleted
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
## 2021-07-18 ENCOUNTER — Telehealth: Payer: Self-pay | Admitting: *Deleted

## 2021-07-18 NOTE — Telephone Encounter (Signed)
No answer on first attempt follow up call. Left message.  ?

## 2021-07-18 NOTE — Telephone Encounter (Signed)
?  Follow up Call- ? ? ?  07/16/2021  ?  1:04 PM  ?Call back number  ?Post procedure Call Back phone  # [phone]  ?Permission to leave phone message Yes  ?  ? ?No answer at 2nd attempt follow up phone call.  Unable to leave a message d/t no VM.  ?

## 2021-07-30 ENCOUNTER — Ambulatory Visit
Admission: RE | Admit: 2021-07-30 | Discharge: 2021-07-30 | Disposition: A | Discharge status: Home / Self Care | Payer: Medicare Other | Source: Ambulatory Visit | Attending: Orthopedic Surgery | Admitting: Orthopedic Surgery

## 2021-07-30 DIAGNOSIS — Z780 Asymptomatic menopausal state: Secondary | ICD-10-CM | POA: Diagnosis not present

## 2021-07-30 DIAGNOSIS — E2839 Other primary ovarian failure: Secondary | ICD-10-CM

## 2021-08-13 ENCOUNTER — Other Ambulatory Visit: Payer: Self-pay | Admitting: Family

## 2021-08-13 DIAGNOSIS — E1165 Type 2 diabetes mellitus with hyperglycemia: Secondary | ICD-10-CM

## 2021-08-14 ENCOUNTER — Telehealth: Payer: Self-pay | Admitting: *Deleted

## 2021-08-14 NOTE — Telephone Encounter (Signed)
Patient called and stated that she didn't know why we didn't send her Rx's to the pharmacy.  ? ?I asked her what medication she was referring to because her insulin was sent to [facility] and her Crestor and Lisinopril have a year supply.  ? ?She stated that she is suppose to be getting her MultiVitamin, stated that the GI Dr. Prescribed for her to take one.  ?I explained to patient that MultiVitamins were OTC but she Arrin Ishler want to call her GI to confirm.  ? ?She agreed.  ?

## 2021-08-29 ENCOUNTER — Emergency Department (HOSPITAL_COMMUNITY): Payer: Medicare Other

## 2021-08-29 ENCOUNTER — Emergency Department (HOSPITAL_COMMUNITY)
Admission: EM | Admit: 2021-08-29 | Discharge: 2021-08-30 | Disposition: A | Discharge status: Home / Self Care | Payer: Medicare Other | Attending: Emergency Medicine | Admitting: Emergency Medicine

## 2021-08-29 ENCOUNTER — Encounter (HOSPITAL_COMMUNITY): Payer: Self-pay

## 2021-08-29 ENCOUNTER — Telehealth: Payer: Self-pay | Admitting: *Deleted

## 2021-08-29 ENCOUNTER — Encounter: Payer: Self-pay | Admitting: Orthopedic Surgery

## 2021-08-29 DIAGNOSIS — Z743 Need for continuous supervision: Secondary | ICD-10-CM | POA: Diagnosis not present

## 2021-08-29 DIAGNOSIS — R079 Chest pain, unspecified: Secondary | ICD-10-CM | POA: Diagnosis not present

## 2021-08-29 DIAGNOSIS — Z20822 Contact with and (suspected) exposure to covid-19: Secondary | ICD-10-CM | POA: Insufficient documentation

## 2021-08-29 DIAGNOSIS — R0789 Other chest pain: Secondary | ICD-10-CM | POA: Diagnosis not present

## 2021-08-29 DIAGNOSIS — R059 Cough, unspecified: Secondary | ICD-10-CM | POA: Diagnosis not present

## 2021-08-29 DIAGNOSIS — J209 Acute bronchitis, unspecified: Secondary | ICD-10-CM | POA: Diagnosis not present

## 2021-08-29 DIAGNOSIS — G4489 Other headache syndrome: Secondary | ICD-10-CM | POA: Diagnosis not present

## 2021-08-29 LAB — CBC WITH DIFFERENTIAL/PLATELET
Abs Immature Granulocytes: 0.02 10*3/uL (ref 0.00–0.07)
Basophils Absolute: 0.1 10*3/uL (ref 0.0–0.1)
Basophils Relative: 1 %
Eosinophils Absolute: 0.4 10*3/uL (ref 0.0–0.5)
Eosinophils Relative: 4 %
HCT: 44.8 % (ref 36.0–46.0)
Hemoglobin: 14.9 g/dL (ref 12.0–15.0)
Immature Granulocytes: 0 %
Lymphocytes Relative: 26 %
Lymphs Abs: 2.5 10*3/uL (ref 0.7–4.0)
MCH: 30.7 pg (ref 26.0–34.0)
MCHC: 33.3 g/dL (ref 30.0–36.0)
MCV: 92.2 fL (ref 80.0–100.0)
Monocytes Absolute: 0.8 10*3/uL (ref 0.1–1.0)
Monocytes Relative: 8 %
Neutro Abs: 6 10*3/uL (ref 1.7–7.7)
Neutrophils Relative %: 61 %
Platelets: 220 10*3/uL (ref 150–400)
RBC: 4.86 MIL/uL (ref 3.87–5.11)
RDW: 14.1 % (ref 11.5–15.5)
WBC: 9.6 10*3/uL (ref 4.0–10.5)
nRBC: 0 % (ref 0.0–0.2)

## 2021-08-29 LAB — TROPONIN I (HIGH SENSITIVITY): Troponin I (High Sensitivity): 2 ng/L (ref <18)

## 2021-08-29 LAB — BASIC METABOLIC PANEL
Anion gap: 7 (ref 5–15)
BUN: 11 mg/dL (ref 8–23)
CO2: 26 mmol/L (ref 22–32)
Calcium: 9.5 mg/dL (ref 8.9–10.3)
Chloride: 108 mmol/L (ref 98–111)
Creatinine, Ser: 0.5 mg/dL (ref 0.44–1.00)
GFR, Estimated: >60 mL/min (ref >60)
Glucose, Bld: 86 mg/dL (ref 70–99)
Potassium: 3.8 mmol/L (ref 3.5–5.1)
Sodium: 141 mmol/L (ref 135–145)

## 2021-08-29 LAB — CBG MONITORING, ED
Glucose-Capillary: 111 mg/dL — ABNORMAL HIGH (ref 70–99)
Glucose-Capillary: 92 mg/dL (ref 70–99)

## 2021-08-29 LAB — RESP PANEL BY RT-PCR (FLU A&B, COVID) ARPGX2
Influenza A by PCR: NEGATIVE
Influenza B by PCR: NEGATIVE
SARS Coronavirus 2 by RT PCR: NEGATIVE

## 2021-08-29 MED ORDER — PREDNISONE 20 MG PO TABS
40.0000 mg | ORAL_TABLET | Freq: Once | ORAL | Status: AC
  Administered 2021-08-30: 40 mg via ORAL
  Filled 2021-08-29: qty 2

## 2021-08-29 MED ORDER — DOXYCYCLINE HYCLATE 100 MG PO TABS
100.0000 mg | ORAL_TABLET | Freq: Once | ORAL | Status: AC
  Administered 2021-08-30: 100 mg via ORAL
  Filled 2021-08-29: qty 1

## 2021-08-29 MED ORDER — DOXYCYCLINE HYCLATE 100 MG PO CAPS: 100.0000 mg | ORAL_CAPSULE | Freq: Two times a day (BID) | ORAL | 0 refills | Status: DC

## 2021-08-29 MED ORDER — PREDNISONE 20 MG PO TABS: 20.0000 mg | ORAL_TABLET | Freq: Every day | ORAL | 0 refills | Status: AC

## 2021-08-29 NOTE — Telephone Encounter (Signed)
Thank you  for update

## 2021-08-29 NOTE — ED Provider Notes (Signed)
? ?[facility] DEPT  ?Provider Note ? ?CSN: [id number] ?Arrival date & time: 08/29/21 1632 ? ?History ?Chief Complaint  ?Patient presents with  ? Cough  ?  Possible pneumonia   ? ? ?Gurpreet Kafer is a 71 y.o. female reports she had a cough about 2 months ago after travelling to the [place] area that resolved after a few days. Cough returned about 3 weeks ago and has worsened in the last few days with yellow and blood tinged sputum, no fever. Some chest tightness. Not particularly SOB. No leg swelling. She reports she thinks it might be due to dust or mold in her house. She is scheduled to see her PCP tomorrow.  ? ? ?Home Medications ?Prior to Admission medications   ?Medication Sig Start Date End Date Taking? Authorizing Provider  ?doxycycline (VIBRAMYCIN) 100 MG capsule Take 1 capsule (100 mg total) by mouth 2 (two) times daily. 08/29/21  Yes Truddie Hidden, MD  ?LANTUS SOLOSTAR 100 UNIT/ML Solostar Pen ADMINISTER 20 UNITS UNDER THE SKIN AT BEDTIME 08/14/21   Fargo, Amy E, NP  ?predniSONE (DELTASONE) 20 MG tablet Take 1 tablet (20 mg total) by mouth daily for 5 days. 08/29/21 09/03/21 Yes Truddie Hidden, MD  ?Acetaminophen (TYLENOL EXTRA STRENGTH PO) Take 1 tablet by mouth as needed.    [provider]  ?B-D UF III MINI PEN NEEDLES 31G X 5 MM MISC Inject 1 Device into the skin 4 (four) times daily. 04/13/21   [provider]  ?blood glucose meter kit and supplies Dispense based on patient and insurance preference. Use up to four times daily as directed. (FOR ICD-10 E10.9, E11.9). 03/29/21   Fargo, Amy E, NP  ?glucose blood test strip 1 each by Other route as needed for other. Use as instructed 04/12/21   Yvonna Alanis, NP  ?Lancets (ACCU-CHEK MULTICLIX) lancets 1 each by Other route as needed for other. Use as instructed 04/12/21   Windell Moulding E, NP  ?lisinopril (ZESTRIL) 5 MG tablet Take 1 tablet (5 mg total) by mouth daily. 03/28/21   Yvonna Alanis, NP  ?rosuvastatin  (CRESTOR) 5 MG tablet Take 1 tablet (5 mg total) by mouth daily. 03/28/21   Yvonna Alanis, NP  ? ? ? ?Allergies    ?Codeine and Penicillins ? ? ?Review of Systems   ?Review of Systems ?Please see HPI for pertinent positives and negatives ? ?Physical Exam ?BP 113/70   Pulse 86   Temp 98.2 ?F (36.8 ?C) (Oral)   Resp 13   SpO2 100%  ? ?Physical Exam ?Vitals and nursing note reviewed.  ?Constitutional:   ?   Appearance: Normal appearance.  ?HENT:  ?   Head: Normocephalic and atraumatic.  ?   Nose: Nose normal.  ?   Mouth/Throat:  ?   Mouth: Mucous membranes are moist.  ?Eyes:  ?   Extraocular Movements: Extraocular movements intact.  ?   Conjunctiva/sclera: Conjunctivae normal.  ?Cardiovascular:  ?   Rate and Rhythm: Normal rate.  ?Pulmonary:  ?   Effort: Pulmonary effort is normal.  ?   Breath sounds: Normal breath sounds.  ?Abdominal:  ?   General: Abdomen is flat.  ?   Palpations: Abdomen is soft.  ?   Tenderness: There is no abdominal tenderness.  ?Musculoskeletal:     ?   General: No swelling. Normal range of motion.  ?   Cervical back: Neck supple.  ?   Right lower leg: No edema.  ?  Left lower leg: No edema.  ?Skin: ?   General: Skin is warm and dry.  ?Neurological:  ?   General: No focal deficit present.  ?   Mental Status: She is alert.  ?Psychiatric:     ?   Mood and Affect: Mood normal.  ? ? ?ED Results / Procedures / Treatments   ?EKG ?EKG Interpretation ? ?Date/Time:  Wednesday Aug 29 2021 17:24:50 EDT ?Ventricular Rate:  84 ?PR Interval:  176 ?QRS Duration: 102 ?QT Interval:  337 ?QTC Calculation: 399 ?R Axis:   12 ?Text Interpretation: Sinus rhythm Low voltage, precordial leads No significant change since last tracing Confirmed by Calvert Cantor [phone]) on 08/29/2021 11:44:04 PM ? ?Procedures ?Procedures ? ?Medications Ordered in the ED ?Medications  ?doxycycline (VIBRA-TABS) tablet 100 mg (has no administration in time range)  ?predniSONE (DELTASONE) tablet 40 mg (has no administration in time  range)  ? ? ?Initial Impression and Plan ? Patient here with 3 weeks of worsening cough, not with blood tinged and yellow sputum. No fevers. She had labs done in triage showing normal CBC, BMP, Trop and Covid/Flu. I personally viewed the images from radiology studies and agree with radiologist interpretation: CXR is clear. Symptoms are most consistent with bronchitis. Given duration and worsening, will give a trial of doxycyline and a course of prednisone. Patient advised this may affect her glucose and was counseled to keep an eye on it at home.  ? ? ?ED Course  ? ?  ? ? ?MDM Rules/Calculators/A&P ?Medical Decision Making ?Given presenting complaint, I considered that admission might be necessary. After review of results from ED lab and/or imaging studies, admission to the hospital is not indicated at this time.  ? ? ?Problems Addressed: ?Acute bronchitis, unspecified organism: acute illness or injury ? ?Amount and/or Complexity of Data Reviewed ?Labs: ordered. Decision-making details documented in ED Course. ?Radiology: ordered and independent interpretation performed. Decision-making details documented in ED Course. ?ECG/medicine tests: ordered and independent interpretation performed. Decision-making details documented in ED Course. ? ?Risk ?Prescription drug management. ?Decision regarding hospitalization. ? ? ? ?Final Clinical Impression(s) / ED Diagnoses ?Final diagnoses:  ?Acute bronchitis, unspecified organism  ? ? ?Rx / DC Orders ?ED Discharge Orders   ? ?      Ordered  ?  doxycycline (VIBRAMYCIN) 100 MG capsule  2 times daily       ? 08/29/21 2346  ?  predniSONE (DELTASONE) 20 MG tablet  Daily       ? 08/29/21 2346  ? ?  ?  ? ?  ? ?  ?Truddie Hidden, MD ?08/29/21 2346 ? ?

## 2021-08-29 NOTE — ED Triage Notes (Signed)
Pt arrived via [facility] from home.  ? ?C/o productive cough X3 days and headache that started today.  ?C/o chest pain with cough.  ? ?Right lower lobes slightly diminished  ? ?Hx: pneumonia  ? ? ?A/Ox4 ?Ambulatory with EMS  ? ?122/84 ?98% RA  ?P-90 ?CBG-120  ?

## 2021-08-29 NOTE — Telephone Encounter (Signed)
Patient called and stated that she is having back and chest pain with cough and congestion. Patient stated that she thinks she has walking pneumonia. Has had the symptoms for 3 weeks now.  ?Stated that her daughter is going to take her to the Urgent Care.  ? ?Patient has an appointment scheduled for tomorrow also and wanted to keep it.  ? ?FYI ?

## 2021-08-29 NOTE — ED Provider Triage Note (Signed)
Emergency Medicine Provider Triage Evaluation Note  [name] , a 71 y.o. female  was evaluated in triage.  Pt complains of cough, cp, ha. No fever, emesis, le swelling  Review of Systems  Positive: Cough, cp, ha Negative:   Physical Exam  BP 128/75   Pulse 77   Temp 98.2 F (36.8 C) (Oral)   Resp 20   SpO2 100%  Gen:   Awake, no distress   Resp:  Normal effort  MSK:   Moves extremities without difficulty  Other:    Medical Decision Making  Medically screening exam initiated at 4:51 PM.  Appropriate orders placed.  [name] was informed that the remainder of the evaluation will be completed by another provider, this initial triage assessment does not replace that evaluation, and the importance of remaining in the ED until their evaluation is complete.  Cough, cp, ha   Aysha Livecchi A, PA-C 08/29/21 1652

## 2021-08-30 ENCOUNTER — Encounter: Payer: Self-pay | Admitting: Orthopedic Surgery

## 2021-08-30 ENCOUNTER — Ambulatory Visit (INDEPENDENT_AMBULATORY_CARE_PROVIDER_SITE_OTHER): Payer: Medicare Other | Admitting: Orthopedic Surgery

## 2021-08-30 VITALS — BP 118/78 | HR 100 | Temp 97.8°F | Ht 65.5 in | Wt 160.3 lb

## 2021-08-30 DIAGNOSIS — Z780 Asymptomatic menopausal state: Secondary | ICD-10-CM | POA: Diagnosis not present

## 2021-08-30 DIAGNOSIS — Z794 Long term (current) use of insulin: Secondary | ICD-10-CM

## 2021-08-30 DIAGNOSIS — R635 Abnormal weight gain: Secondary | ICD-10-CM | POA: Diagnosis not present

## 2021-08-30 DIAGNOSIS — E1165 Type 2 diabetes mellitus with hyperglycemia: Secondary | ICD-10-CM | POA: Diagnosis not present

## 2021-08-30 DIAGNOSIS — J069 Acute upper respiratory infection, unspecified: Secondary | ICD-10-CM | POA: Diagnosis not present

## 2021-08-30 DIAGNOSIS — J209 Acute bronchitis, unspecified: Secondary | ICD-10-CM | POA: Diagnosis not present

## 2021-08-30 MED ORDER — ASPIRIN 81 MG PO TBEC: 81.0000 mg | DELAYED_RELEASE_TABLET | Freq: Every day | ORAL | 12 refills | Status: AC

## 2021-08-30 NOTE — Patient Instructions (Addendum)
May purchase diabetic tussin over the counter for cough  Caltrate chews- please purchase for bone health it has calcium and vitamin D in it  Please take prednisone pills in the morning  Please start taking aspirin 81 mg daily for cardiac/diabetes protection

## 2021-08-30 NOTE — Progress Notes (Signed)
Careteam: Patient Care Team: Yvonna Alanis, NP as PCP - General (Adult Health Nurse Practitioner)  Seen by: Windell Moulding, AGNP-C  PLACE OF SERVICE:  [place] Directive information Does Patient Have a Medical Advance Directive?: No, Would patient like information on creating a medical advance directive?: No - Patient declined  Allergies  Allergen Reactions   Codeine Rash   Penicillins Rash    Has patient had a PCN reaction causing immediate rash, facial/tongue/throat swelling, SOB or lightheadedness with hypotension: Yes Has patient had a PCN reaction causing severe rash involving mucus membranes or skin necrosis: No Has patient had a PCN reaction that required hospitalization: No Has patient had a PCN reaction occurring within the last 10 years: No If all of the above answers are "NO", then may proceed with Cephalosporin use.     Chief Complaint  Patient presents with   Medical Management of Chronic Issues    Patient presents today for 4 month follow up.   Quality Metric Gaps    Discuss the need for eye exam, or post pone if patient refuses.      HPI: Patient is a 71 y.o. female seen today for medical management of chronic conditions.   05/17 she presented to the ED due to cough. CXR unremarkable. Diagnosed with URI and given prescription for doxycycline and prednisone. She has not started medications. She is asking for cough suppressant. Discussed diabetic tussin OTC.   Diabetes- admits to following a better diet, burning and tingling to her extremities have subsided. She is checking her sugars daily, reports averaging 120-250's. She is taking Lantus, lisinopril, and Crestor daily. Discussed starting aspirin today.   DEXA 04/17, t score-0.7. discussed starting calcium and vitamin d supplement.     Review of Systems:  Review of Systems  Constitutional:  Negative for chills, fever, malaise/fatigue and weight loss.  HENT:  Negative for congestion.   Eyes:   Negative for blurred vision and double vision.  Respiratory:  Negative for cough, shortness of breath and wheezing.   Cardiovascular:  Negative for chest pain and leg swelling.  Gastrointestinal:  Negative for abdominal pain, blood in stool, constipation, diarrhea, heartburn, nausea and vomiting.  Genitourinary:  Negative for dysuria and frequency.  Musculoskeletal:  Negative for joint pain and myalgias.  Skin:  Negative for rash.  Neurological:  Negative for dizziness, sensory change and headaches.  Psychiatric/Behavioral:  Negative for depression. The patient is not nervous/anxious and does not have insomnia.    Past Medical History:  Diagnosis Date   Arthritis    Cataract 2022   bilateral sx   Depression    Diabetes mellitus without complication ([place])    on meds   Hyperlipidemia    on meds   Hypertension    on meds   Past Surgical History:  Procedure Laterality Date   HAND SURGERY Left 1972   TOTAL KNEE ARTHROPLASTY Left 2012   TOTAL KNEE ARTHROPLASTY Right 2017   Social History:   reports that she quit smoking about 2 years ago. Her smoking use included cigarettes. She has a 30.00 pack-year smoking history. She has never used smokeless tobacco. She reports that she does not drink alcohol and does not use drugs.  Family History  Problem Relation Age of Onset   Cancer Mother    Prostate cancer Father    Cancer Father    Colon polyps Neg Hx    Colon cancer Neg Hx    Esophageal cancer Neg Hx  Rectal cancer Neg Hx    Stomach cancer Neg Hx     Medications: Patient's Medications  New Prescriptions   No medications on file  Previous Medications   ACETAMINOPHEN (TYLENOL EXTRA STRENGTH PO)    Take 1 tablet by mouth as needed.   B-D UF III MINI PEN NEEDLES 31G X 5 MM MISC    Inject 1 Device into the skin 4 (four) times daily.   BLOOD GLUCOSE METER KIT AND SUPPLIES    Dispense based on patient and insurance preference. Use up to four times daily as directed. (FOR ICD-10  E10.9, E11.9).   DOXYCYCLINE (VIBRAMYCIN) 100 MG CAPSULE    Take 1 capsule (100 mg total) by mouth 2 (two) times daily.   GLUCOSE BLOOD TEST STRIP    1 each by Other route as needed for other. Use as instructed   LANCETS (ACCU-CHEK MULTICLIX) LANCETS    1 each by Other route as needed for other. Use as instructed   LANTUS SOLOSTAR 100 UNIT/ML SOLOSTAR PEN    ADMINISTER 20 UNITS UNDER THE SKIN AT BEDTIME   LISINOPRIL (ZESTRIL) 5 MG TABLET    Take 1 tablet (5 mg total) by mouth daily.   PREDNISONE (DELTASONE) 20 MG TABLET    Take 1 tablet (20 mg total) by mouth daily for 5 days.   ROSUVASTATIN (CRESTOR) 5 MG TABLET    Take 1 tablet (5 mg total) by mouth daily.  Modified Medications   No medications on file  Discontinued Medications   No medications on file    Physical Exam:  Vitals:   08/30/21 0838  BP: 118/78  Pulse: 100  Temp: 97.8 F (36.6 C)  TempSrc: Temporal  SpO2: 94%  Weight: 160 lb 4.8 oz (72.7 kg)  Height: 5' 5.5" (1.664 m)   Body mass index is 26.27 kg/m. Wt Readings from Last 3 Encounters:  08/30/21 160 lb 4.8 oz (72.7 kg)  07/02/21 143 lb (64.9 kg)  06/07/21 147 lb 3.2 oz (66.8 kg)    Physical Exam Vitals reviewed.  Constitutional:      General: She is not in acute distress. HENT:     Head: Normocephalic.  Eyes:     General:        Right eye: No discharge.        Left eye: No discharge.  Cardiovascular:     Rate and Rhythm: Normal rate and regular rhythm.     Pulses: Normal pulses.     Heart sounds: Normal heart sounds.  Pulmonary:     Effort: Pulmonary effort is normal. No respiratory distress.     Breath sounds: Normal breath sounds. No wheezing.  Abdominal:     General: Bowel sounds are normal. There is no distension.     Palpations: Abdomen is soft.     Tenderness: There is no abdominal tenderness.  Musculoskeletal:     Cervical back: Neck supple.     Right lower leg: No edema.     Left lower leg: No edema.  Skin:    General: Skin is warm  and dry.     Capillary Refill: Capillary refill takes less than 2 seconds.  Neurological:     General: No focal deficit present.     Mental Status: She is alert and oriented to person, place, and time.  Psychiatric:        Mood and Affect: Mood normal.        Behavior: Behavior normal.    Labs reviewed: Basic Metabolic  Panel: Recent Labs    03/26/21 1040 06/07/21 1201 08/29/21 1717  NA 130* 139 141  K 4.0 4.8 3.8  CL 98 107 108  CO2 _0 GLUCOSE 435* 109 86  BUN _1 CREATININE 0.63 0.49* 0.50  CALCIUM 9.6 9.3 9.5   Liver Function Tests: Recent Labs    03/26/21 1040 06/07/21 1201  AST 10 17  ALT 8 8  BILITOT 0.5 0.5  PROT 7.1 6.8   No results for input(s): LIPASE, AMYLASE in the last 8760 hours. No results for input(s): AMMONIA in the last 8760 hours. CBC: Recent Labs    03/26/21 1040 06/07/21 1201 08/29/21 1717  WBC 6.4 5.7 9.6  NEUTROABS 3,174 2,006 6.0  HGB 16.0* 13.5 14.9  HCT 46.1* 42.1 44.8  MCV 90.4 90.5 92.2  PLT 219 334 220   Lipid Panel: Recent Labs    03/26/21 1040  CHOL 183  HDL 54  LDLCALC 109*  TRIG 103  CHOLHDL 3.4   TSH: No results for input(s): TSH in the last 8760 hours. A1C: Lab Results  Component Value Date   HGBA1C 9.5 (H) 06/07/2021     Assessment/Plan 1. Type 2 diabetes mellitus with hyperglycemia, with long-term current use of insulin (HCC) - A1c 9.5 (2/23)> 14.0 (03/26/2021) - cont Lantus, statin, lisinopril - Hemoglobin A1c - diabetic eye exam- future - aspirin EC 81 MG tablet; Take 1 tablet (81 mg total) by mouth daily. Swallow whole.  Dispense: 30 tablet; Refill: 12  2. Upper respiratory tract infection, unspecified type - CXR unremarkable - cont doxycycline and prednisone - recommend diabetic tussin  3. Postmenopausal - DEXA 04/17, t score -0.7 - recommend starting calcium and vitamin D supplement  4. Weight gain - gained 13 lbs in past 4 months - BMI 26.27  - advised to limit calories to <  1500/day  Total time: 32 minutes. Greater than 50% of total time spent doing patient education regarding diabetes including symptom and medication management.     Next appt: 12/06/2021  Windell Moulding, [facility] [phone]

## 2021-08-31 ENCOUNTER — Telehealth: Payer: Self-pay | Admitting: *Deleted

## 2021-08-31 ENCOUNTER — Other Ambulatory Visit: Payer: Self-pay | Admitting: Orthopedic Surgery

## 2021-08-31 DIAGNOSIS — E1165 Type 2 diabetes mellitus with hyperglycemia: Secondary | ICD-10-CM

## 2021-08-31 DIAGNOSIS — J069 Acute upper respiratory infection, unspecified: Secondary | ICD-10-CM

## 2021-08-31 LAB — HEMOGLOBIN A1C
Hgb A1c MFr Bld: 5.8 % of total Hgb — ABNORMAL HIGH (ref <5.7)
Mean Plasma Glucose: 120 mg/dL
eAG (mmol/L): 6.6 mmol/L

## 2021-08-31 MED ORDER — AZITHROMYCIN 250 MG PO TABS: ORAL_TABLET | ORAL | 0 refills | Status: AC

## 2021-08-31 MED ORDER — METFORMIN HCL 500 MG PO TABS: 500.0000 mg | ORAL_TABLET | Freq: Every morning | ORAL | 3 refills | Status: DC

## 2021-08-31 NOTE — Telephone Encounter (Signed)
Patient notified and agreed.  

## 2021-08-31 NOTE — Telephone Encounter (Signed)
Patient called and stated that she was seen yesterday.  Stated that she woke up this morning with a rash on her legs and arms. Stated that it was puffy and alittle red. No Fever. No other symptoms noted.   Stated that she thinks she is having a reaction to the Prednisone and Doxycycline. Thinks it is too strong.   Patient stated that she needs some Benadryl prescribed.   Please Advise.

## 2021-08-31 NOTE — Telephone Encounter (Signed)
Recommend stopping the doxycycline. I have sent a prescription for a Z-pack in replace of doxycycline. She can take benadryl over the counter for rash or hydrocortisone cream.  In addition, her A1c went from 9.0 to 5.8. I have ordered metformin ( one pill every morning after breakfast). She can stop Lantus (insulin) injections every night and transition to oral medication. Plan to recheck A1c next routine visit.

## 2021-09-12 ENCOUNTER — Ambulatory Visit (INDEPENDENT_AMBULATORY_CARE_PROVIDER_SITE_OTHER): Payer: Medicare Other | Admitting: Podiatry

## 2021-09-12 DIAGNOSIS — M79674 Pain in right toe(s): Secondary | ICD-10-CM

## 2021-09-12 DIAGNOSIS — M79675 Pain in left toe(s): Secondary | ICD-10-CM

## 2021-09-12 DIAGNOSIS — B351 Tinea unguium: Secondary | ICD-10-CM

## 2021-09-12 NOTE — Progress Notes (Signed)
   SUBJECTIVE Patient with a history of diabetes mellitus presents to office today complaining of elongated, thickened nails that cause pain while ambulating in shoes.  Patient is unable to trim their own nails. Patient is here for further evaluation and treatment.   Past Medical History:  Diagnosis Date   Arthritis    Cataract 2022   bilateral sx   Depression    Diabetes mellitus without complication (HCC)    on meds   Hyperlipidemia    on meds   Hypertension    on meds   Past Surgical History:  Procedure Laterality Date   HAND SURGERY Left 1972   TOTAL KNEE ARTHROPLASTY Left 2012   TOTAL KNEE ARTHROPLASTY Right 2017   Allergies  Allergen Reactions   Codeine Rash   Doxycycline Rash    Rash on legs   Penicillins Rash    Has patient had a PCN reaction causing immediate rash, facial/tongue/throat swelling, SOB or lightheadedness with hypotension: Yes Has patient had a PCN reaction causing severe rash involving mucus membranes or skin necrosis: No Has patient had a PCN reaction that required hospitalization: No Has patient had a PCN reaction occurring within the last 10 years: No If all of the above answers are "NO", then may proceed with Cephalosporin use.      OBJECTIVE General Patient is awake, alert, and oriented x 3 and in no acute distress. Derm Skin is dry and supple bilateral. Negative open lesions or macerations. Remaining integument unremarkable. Nails are tender, long, thickened and dystrophic with subungual debris, consistent with onychomycosis, 1-5 bilateral. No signs of infection noted. Vasc  DP and PT pedal pulses palpable bilaterally. Temperature gradient within normal limits.  Neuro Epicritic and protective threshold sensation diminished bilaterally.  Musculoskeletal Exam No symptomatic pedal deformities noted bilateral. Muscular strength within normal limits.  ASSESSMENT 1. Diabetes Mellitus w/ peripheral neuropathy 2.  Pain due to onychomycosis of  toenails bilateral  PLAN OF CARE 1. Patient evaluated today. 2. Instructed to maintain good pedal hygiene and foot care. Stressed importance of controlling blood sugar.  3. Mechanical debridement of nails 1-5 bilaterally performed using a nail nipper. Filed with dremel without incident.  4. Return to clinic in 3 mos.     Felecia Shelling, DPM [facility]  Dr. Felecia Shelling, DPM    [address] [place], [place] [address]                Office [phone]  Fax [phone]

## 2021-09-13 ENCOUNTER — Telehealth: Payer: Self-pay | Admitting: *Deleted

## 2021-09-13 NOTE — Telephone Encounter (Signed)
Patient walked into office with Letter Amy wrote on 08/30/2021 stating that it needed her Physical address: [facility] [address] [place], [place] [address]  Stated on the letter. Revised the letter from 08/30/2021 with physical address instead of [address].   Letter stamped and given to patient.

## 2021-09-26 ENCOUNTER — Telehealth: Payer: Self-pay | Admitting: *Deleted

## 2021-09-26 NOTE — Telephone Encounter (Signed)
Patient no showed PV today at 9 am - Called patient a 852 am, 854 am, 858 am, 905 am and 910 am-  each time her phone would ring several times, then go to a fast busy signal - I attempted her daughter Jasmine December at 37 am and there was no answer-     PV and Procedure both canceled- No Show letter mailed to patient    Elizebeth Brooking

## 2021-10-04 ENCOUNTER — Ambulatory Visit: Payer: Medicare Other | Admitting: Orthopedic Surgery

## 2021-10-10 ENCOUNTER — Encounter: Payer: Medicare Other | Admitting: Gastroenterology

## 2021-12-06 ENCOUNTER — Ambulatory Visit: Payer: Medicare Other | Admitting: Orthopedic Surgery

## 2021-12-14 ENCOUNTER — Ambulatory Visit: Payer: Medicare Other | Admitting: Podiatry

## 2022-01-03 ENCOUNTER — Ambulatory Visit: Payer: Medicare Other | Admitting: Orthopedic Surgery

## 2022-01-10 ENCOUNTER — Encounter: Payer: Self-pay | Admitting: Orthopedic Surgery

## 2022-01-10 ENCOUNTER — Other Ambulatory Visit: Payer: Self-pay

## 2022-01-10 ENCOUNTER — Ambulatory Visit (INDEPENDENT_AMBULATORY_CARE_PROVIDER_SITE_OTHER): Payer: Medicare Other | Admitting: Orthopedic Surgery

## 2022-01-10 VITALS — BP 124/62 | HR 72 | Temp 97.6°F | Resp 17 | Ht 65.5 in | Wt 164.8 lb

## 2022-01-10 DIAGNOSIS — Z780 Asymptomatic menopausal state: Secondary | ICD-10-CM | POA: Diagnosis not present

## 2022-01-10 DIAGNOSIS — E1165 Type 2 diabetes mellitus with hyperglycemia: Secondary | ICD-10-CM

## 2022-01-10 DIAGNOSIS — Z794 Long term (current) use of insulin: Secondary | ICD-10-CM | POA: Diagnosis not present

## 2022-01-10 DIAGNOSIS — Z23 Encounter for immunization: Secondary | ICD-10-CM

## 2022-01-10 DIAGNOSIS — R635 Abnormal weight gain: Secondary | ICD-10-CM | POA: Diagnosis not present

## 2022-01-10 MED ORDER — ROSUVASTATIN CALCIUM 5 MG PO TABS: 5.0000 mg | ORAL_TABLET | Freq: Every day | ORAL | 3 refills | Status: DC

## 2022-01-10 MED ORDER — BD PEN NEEDLE MINI U/F 31G X 5 MM MISC: 1.0000 | Freq: Four times a day (QID) | 10 refills | Status: DC

## 2022-01-10 MED ORDER — METFORMIN HCL 500 MG PO TABS: 500.0000 mg | ORAL_TABLET | Freq: Every morning | ORAL | 3 refills | Status: DC

## 2022-01-10 MED ORDER — LISINOPRIL 5 MG PO TABS: 5.0000 mg | ORAL_TABLET | Freq: Every day | ORAL | 3 refills | Status: DC

## 2022-01-10 MED ORDER — GLUCOSE BLOOD VI STRP: 1.0000 | ORAL_STRIP | 2 refills | Status: AC | PRN

## 2022-01-10 NOTE — Progress Notes (Signed)
Careteam: Patient Care Team: Yvonna Alanis, NP as PCP - General (Adult Health Nurse Practitioner)  Seen by: Windell Moulding, AGNP-C  PLACE OF SERVICE:  [place] Directive information Does Patient Have a Medical Advance Directive?: No, Would patient like information on creating a medical advance directive?: No - Patient declined  Allergies  Allergen Reactions   Codeine Rash   Doxycycline Rash    Rash on legs   Penicillins Rash    Has patient had a PCN reaction causing immediate rash, facial/tongue/throat swelling, SOB or lightheadedness with hypotension: Yes Has patient had a PCN reaction causing severe rash involving mucus membranes or skin necrosis: No Has patient had a PCN reaction that required hospitalization: No Has patient had a PCN reaction occurring within the last 10 years: No If all of the above answers are "NO", then may proceed with Cephalosporin use.     Chief Complaint  Patient presents with   Medical Management of Chronic Issues    Follow up on medical Management of chronic issues    Immunizations    Need for flu vaccine and shingles vaccine. Patient would like to decline all vaccines   Health Maintenance    Discussed need for eye exam and urine ACR     HPI: Patient is a 71 y.o. female seen today for medical management of chronic.   No health concerns today.   She is checking her sugars daily. Denies hypoglycemia. Remains on asa, lisinopril, crestor and metformin. She reports having eye exam, but cannot remember month or name of provider.   Gained almost 20 lbs in 6 months. She reports exercising a few times weekly- walking. Admits to eating a lot of lamb in diet.   Refusing flu vaccine today.      Review of Systems:  Review of Systems  Constitutional:  Negative for chills, fever, malaise/fatigue and weight loss.  HENT:  Negative for hearing loss and sore throat.   Eyes:  Negative for blurred vision and double vision.  Respiratory:   Negative for cough, shortness of breath and wheezing.   Cardiovascular:  Negative for chest pain and leg swelling.  Gastrointestinal:  Negative for constipation and heartburn.  Genitourinary:  Negative for dysuria and frequency.  Musculoskeletal:  Negative for falls and joint pain.  Skin:  Negative for rash.  Neurological:  Negative for dizziness and weakness.  Endo/Heme/Allergies:  Negative for polydipsia.  Psychiatric/Behavioral:  Negative for depression. The patient is not nervous/anxious and does not have insomnia.     Past Medical History:  Diagnosis Date   Arthritis    Cataract 2022   bilateral sx   Depression    Diabetes mellitus without complication ([place])    on meds   Hyperlipidemia    on meds   Hypertension    on meds   Past Surgical History:  Procedure Laterality Date   HAND SURGERY Left 1972   TOTAL KNEE ARTHROPLASTY Left 2012   TOTAL KNEE ARTHROPLASTY Right 2017   Social History:   reports that she quit smoking about 2 years ago. Her smoking use included cigarettes. She has a 30.00 pack-year smoking history. She has never used smokeless tobacco. She reports that she does not drink alcohol and does not use drugs.  Family History  Problem Relation Age of Onset   Cancer Mother    Prostate cancer Father    Cancer Father    Colon polyps Neg Hx    Colon cancer Neg Hx  Esophageal cancer Neg Hx    Rectal cancer Neg Hx    Stomach cancer Neg Hx     Medications: Patient's Medications  New Prescriptions   No medications on file  Previous Medications   ACETAMINOPHEN (TYLENOL EXTRA STRENGTH PO)    Take 1 tablet by mouth as needed.   ASPIRIN EC 81 MG TABLET    Take 1 tablet (81 mg total) by mouth daily. Swallow whole.   B-D UF III MINI PEN NEEDLES 31G X 5 MM MISC    Inject 1 Device into the skin 4 (four) times daily.   BLOOD GLUCOSE METER KIT AND SUPPLIES    Dispense based on patient and insurance preference. Use up to four times daily as directed. (FOR ICD-10  E10.9, E11.9).   GLUCOSE BLOOD TEST STRIP    1 each by Other route as needed for other. Use as instructed   LANCETS (ACCU-CHEK MULTICLIX) LANCETS    1 each by Other route as needed for other. Use as instructed   LISINOPRIL (ZESTRIL) 5 MG TABLET    Take 1 tablet (5 mg total) by mouth daily.   METFORMIN (GLUCOPHAGE) 500 MG TABLET    Take 1 tablet (500 mg total) by mouth every morning.   ROSUVASTATIN (CRESTOR) 5 MG TABLET    Take 1 tablet (5 mg total) by mouth daily.  Modified Medications   No medications on file  Discontinued Medications   No medications on file    Physical Exam:  Vitals:   01/10/22 1015  BP: 124/62  Pulse: 72  Resp: 17  Temp: 97.6 F (36.4 C)  TempSrc: Temporal  SpO2: 99%  Weight: 164 lb 12.8 oz (74.8 kg)  Height: 5' 5.5" (1.664 m)   Body mass index is 27.01 kg/m. Wt Readings from Last 3 Encounters:  01/10/22 164 lb 12.8 oz (74.8 kg)  08/30/21 160 lb 4.8 oz (72.7 kg)  07/02/21 143 lb (64.9 kg)    Physical Exam Vitals reviewed.  Constitutional:      General: She is not in acute distress. HENT:     Head: Normocephalic.  Eyes:     General:        Right eye: No discharge.        Left eye: No discharge.  Cardiovascular:     Rate and Rhythm: Normal rate and regular rhythm.     Pulses: Normal pulses.     Heart sounds: Normal heart sounds.  Pulmonary:     Effort: Pulmonary effort is normal. No respiratory distress.     Breath sounds: Normal breath sounds. No wheezing.  Abdominal:     General: Bowel sounds are normal. There is no distension.     Palpations: Abdomen is soft.     Tenderness: There is no abdominal tenderness.  Musculoskeletal:     Cervical back: Neck supple.     Right lower leg: No edema.     Left lower leg: No edema.  Skin:    General: Skin is warm and dry.     Capillary Refill: Capillary refill takes less than 2 seconds.  Neurological:     General: No focal deficit present.     Mental Status: She is alert and oriented to person,  place, and time.  Psychiatric:        Mood and Affect: Mood normal.        Behavior: Behavior normal.     Labs reviewed: Basic Metabolic Panel: Recent Labs    03/26/21 1040 06/07/21 1201 08/29/21  1717  NA 130* 139 141  K 4.0 4.8 3.8  CL 98 107 108  CO2 '21 25 26  ' GLUCOSE 435* 109 86  BUN '12 9 11  ' CREATININE 0.63 0.49* 0.50  CALCIUM 9.6 9.3 9.5   Liver Function Tests: Recent Labs    03/26/21 1040 06/07/21 1201  AST 10 17  ALT 8 8  BILITOT 0.5 0.5  PROT 7.1 6.8   No results for input(s): "LIPASE", "AMYLASE" in the last 8760 hours. No results for input(s): "AMMONIA" in the last 8760 hours. CBC: Recent Labs    03/26/21 1040 06/07/21 1201 08/29/21 1717  WBC 6.4 5.7 9.6  NEUTROABS 3,174 2,006 6.0  HGB 16.0* 13.5 14.9  HCT 46.1* 42.1 44.8  MCV 90.4 90.5 92.2  PLT 219 334 220   Lipid Panel: Recent Labs    03/26/21 1040  CHOL 183  HDL 54  LDLCALC 109*  TRIG 103  CHOLHDL 3.4   TSH: No results for input(s): "TSH" in the last 8760 hours. A1C: Lab Results  Component Value Date   HGBA1C 5.8 (H) 08/30/2021     Assessment/Plan 1. Weight gain - weight increased 18 lbs in 6 months - advised limiting calories < 1500/day - recommend exercising 150 min/week - TSH  2. Type 2 diabetes mellitus with hyperglycemia, with long-term current use of insulin (HCC) - A1c 14.0 (03/2021)> 9.5 (05/2021)> 5.8 (05/18) - no recent hypoglycemias - see above - cont asa, ACE, statin, metformin - reports having eye exam- cannot tell date or provider - CBC with Differential/Platelet; Future - CMP with eGFR([facility]) - Hemoglobin A1c - Microalbumin/Creatinine Ratio, Urine - lisinopril (ZESTRIL) 5 MG tablet; Take 1 tablet (5 mg total) by mouth daily.  Dispense: 90 tablet; Refill: 3 - metFORMIN (GLUCOPHAGE) 500 MG tablet; Take 1 tablet (500 mg total) by mouth every morning.  Dispense: 90 tablet; Refill: 3 - rosuvastatin (CRESTOR) 5 MG tablet; Take 1 tablet (5 mg total) by mouth  daily.  Dispense: 90 tablet; Refill: 3 - glucose blood test strip; 1 each by Other route as needed for other. Use as instructed  Dispense: 100 each; Refill: 2 - B-D UF III MINI PEN NEEDLES 31G X 5 MM MISC; Inject 1 Device into the skin 4 (four) times daily.  Dispense: 100 each; Refill: 10  3. Postmenopausal - DEXA 07/2021, t score -0.7 - cont weight bearing exercise - recommend calcium/vit d supplement  Total time: 28 minutes. Greater than 50% of total time spent doing patient education regarding T2DM, health maintenance, and weight gain.   Next appt: Visit date not found  [place], [facility] [phone]

## 2022-01-11 LAB — COMPLETE METABOLIC PANEL WITH GFR
AG Ratio: 1.5 (calc) (ref 1.0–2.5)
ALT: 10 U/L (ref 6–29)
AST: 11 U/L (ref 10–35)
Albumin: 4 g/dL (ref 3.6–5.1)
Alkaline phosphatase (APISO): 86 U/L (ref 37–153)
BUN: 10 mg/dL (ref 7–25)
CO2: 27 mmol/L (ref 20–32)
Calcium: 9.6 mg/dL (ref 8.6–10.4)
Chloride: 104 mmol/L (ref 98–110)
Creat: 0.66 mg/dL (ref 0.50–1.05)
Globulin: 2.7 g/dL (calc) (ref 1.9–3.7)
Glucose, Bld: 122 mg/dL (ref 65–139)
Potassium: 4.3 mmol/L (ref 3.5–5.3)
Sodium: 137 mmol/L (ref 135–146)
Total Bilirubin: 0.3 mg/dL (ref 0.2–1.2)
Total Protein: 6.7 g/dL (ref 6.1–8.1)
eGFR: 95 mL/min/{1.73_m2} (ref >=60)

## 2022-01-11 LAB — MICROALBUMIN / CREATININE URINE RATIO
Creatinine, Urine: 93 mg/dL (ref 20–275)
Microalb Creat Ratio: 3 mcg/mg creat (ref <30)
Microalb, Ur: 0.3 mg/dL

## 2022-01-11 LAB — HEMOGLOBIN A1C
Hgb A1c MFr Bld: 6.7 % of total Hgb — ABNORMAL HIGH (ref <5.7)
Mean Plasma Glucose: 146 mg/dL
eAG (mmol/L): 8.1 mmol/L

## 2022-01-11 LAB — TSH: TSH: 0.92 mIU/L (ref 0.40–4.50)

## 2022-03-25 ENCOUNTER — Ambulatory Visit (INDEPENDENT_AMBULATORY_CARE_PROVIDER_SITE_OTHER): Payer: Medicare Other | Admitting: Podiatry

## 2022-03-25 ENCOUNTER — Encounter: Payer: Self-pay | Admitting: Podiatry

## 2022-03-25 VITALS — BP 116/75

## 2022-03-25 DIAGNOSIS — Z794 Long term (current) use of insulin: Secondary | ICD-10-CM

## 2022-03-25 DIAGNOSIS — M79675 Pain in left toe(s): Secondary | ICD-10-CM | POA: Diagnosis not present

## 2022-03-25 DIAGNOSIS — L84 Corns and callosities: Secondary | ICD-10-CM | POA: Diagnosis not present

## 2022-03-25 DIAGNOSIS — B351 Tinea unguium: Secondary | ICD-10-CM

## 2022-03-25 DIAGNOSIS — E119 Type 2 diabetes mellitus without complications: Secondary | ICD-10-CM | POA: Diagnosis not present

## 2022-03-25 DIAGNOSIS — M79674 Pain in right toe(s): Secondary | ICD-10-CM

## 2022-03-25 NOTE — Progress Notes (Signed)
  Subjective:  Patient ID: [name], female    DOB: 10-22-1950,  MRN: [id number]  [name] presents to clinic today for preventative diabetic foot care and callus(es) left lower extremity and painful thick toenails that are difficult to trim. Painful toenails interfere with ambulation. Aggravating factors include wearing enclosed shoe gear. Pain is relieved with periodic professional debridement. Painful calluses are aggravated when weightbearing with and without shoegear. Pain is relieved with periodic professional debridement.  Chief Complaint  Patient presents with   Nail Problem    [facility] [id number] A1C-do not know PCP-Amy Fargo PCP VST-02/2022   New problem(s): None.   PCP is Octavia Heir, NP.  Allergies  Allergen Reactions   Codeine Rash   Doxycycline Rash    Rash on legs   Penicillins Rash    Has patient had a PCN reaction causing immediate rash, facial/tongue/throat swelling, SOB or lightheadedness with hypotension: Yes Has patient had a PCN reaction causing severe rash involving mucus membranes or skin necrosis: No Has patient had a PCN reaction that required hospitalization: No Has patient had a PCN reaction occurring within the last 10 years: No If all of the above answers are "NO", then may proceed with Cephalosporin use.    Review of Systems: Negative except as noted in the HPI.  Objective:  Vitals:   03/25/22 1527  BP: 116/75   [name] is a pleasant 71 y.o. female WD, WN in NAD. AAO x 3.  Vascular Examination: Capillary refill time to digits immediate b/l. Palpable pedal pulses b/l LE. Pedal hair present. No pain with calf compression b/l. Lower extremity skin temperature gradient within normal limits. No edema noted b/l LE. No cyanosis or clubbing noted b/l LE.  Dermatological Examination: Pedal integument with normal turgor, texture and tone b/l LE. No open wounds b/l. No interdigital macerations b/l.   Toenails 1-5 b/l elongated, thickened,  discolored with subungual debris. +Tenderness with dorsal palpation of nailplates.   Hyperkeratotic lesion(s) noted dorsal PIPJ of bilateral 5th toes submet head 5 left foot.  Neurological Examination: Protective sensation intact 5/5 intact bilaterally with 10g monofilament b/l. Vibratory sensation intact b/l. Proprioception intact bilaterally.  Musculoskeletal Examination: Normal muscle strength 5/5 to all lower extremity muscle groups bilaterally. No pain, crepitus or joint limitation noted with ROM b/l LE. No gross bony pedal deformities b/l. Patient ambulates independently without assistive aids.  Assessment/Plan: 1. Pain due to onychomycosis of toenails of both feet   2. Corns and callosities   3. Diabetes mellitus type 2, insulin dependent (HCC)     No orders of the defined types were placed in this encounter.   -Patient was evaluated and treated. All patient's and/or POA's questions/concerns answered on today's visit. -Continue diabetic foot care principles: inspect feet daily, monitor glucose as recommended by PCP and/or Endocrinologist, and follow prescribed diet per PCP, Endocrinologist and/or dietician. -Patient to continue soft, supportive shoe gear daily. -Toenails 1-5 b/l were debrided in length and girth with sterile nail nippers and dremel without iatrogenic bleeding.  -Corn(s) bilateral 5th toes and callus(es) submet head 5 left foot were pared utilizing sterile scalpel blade without incident. Total number debrided =3. -Patient/POA to call should there be question/concern in the interim.   Return in about 3 months (around 06/24/2022).  Freddie Breech, DPM

## 2022-04-01 IMAGING — CR DG FOOT COMPLETE 3+V*L*
3 series · 3 of 3 positions shown · non-contrast
Comparison: None.

CLINICAL DATA: Plantar foot pain

EXAM:
LEFT FOOT - COMPLETE 3+ VIEW

[x foot ap left]
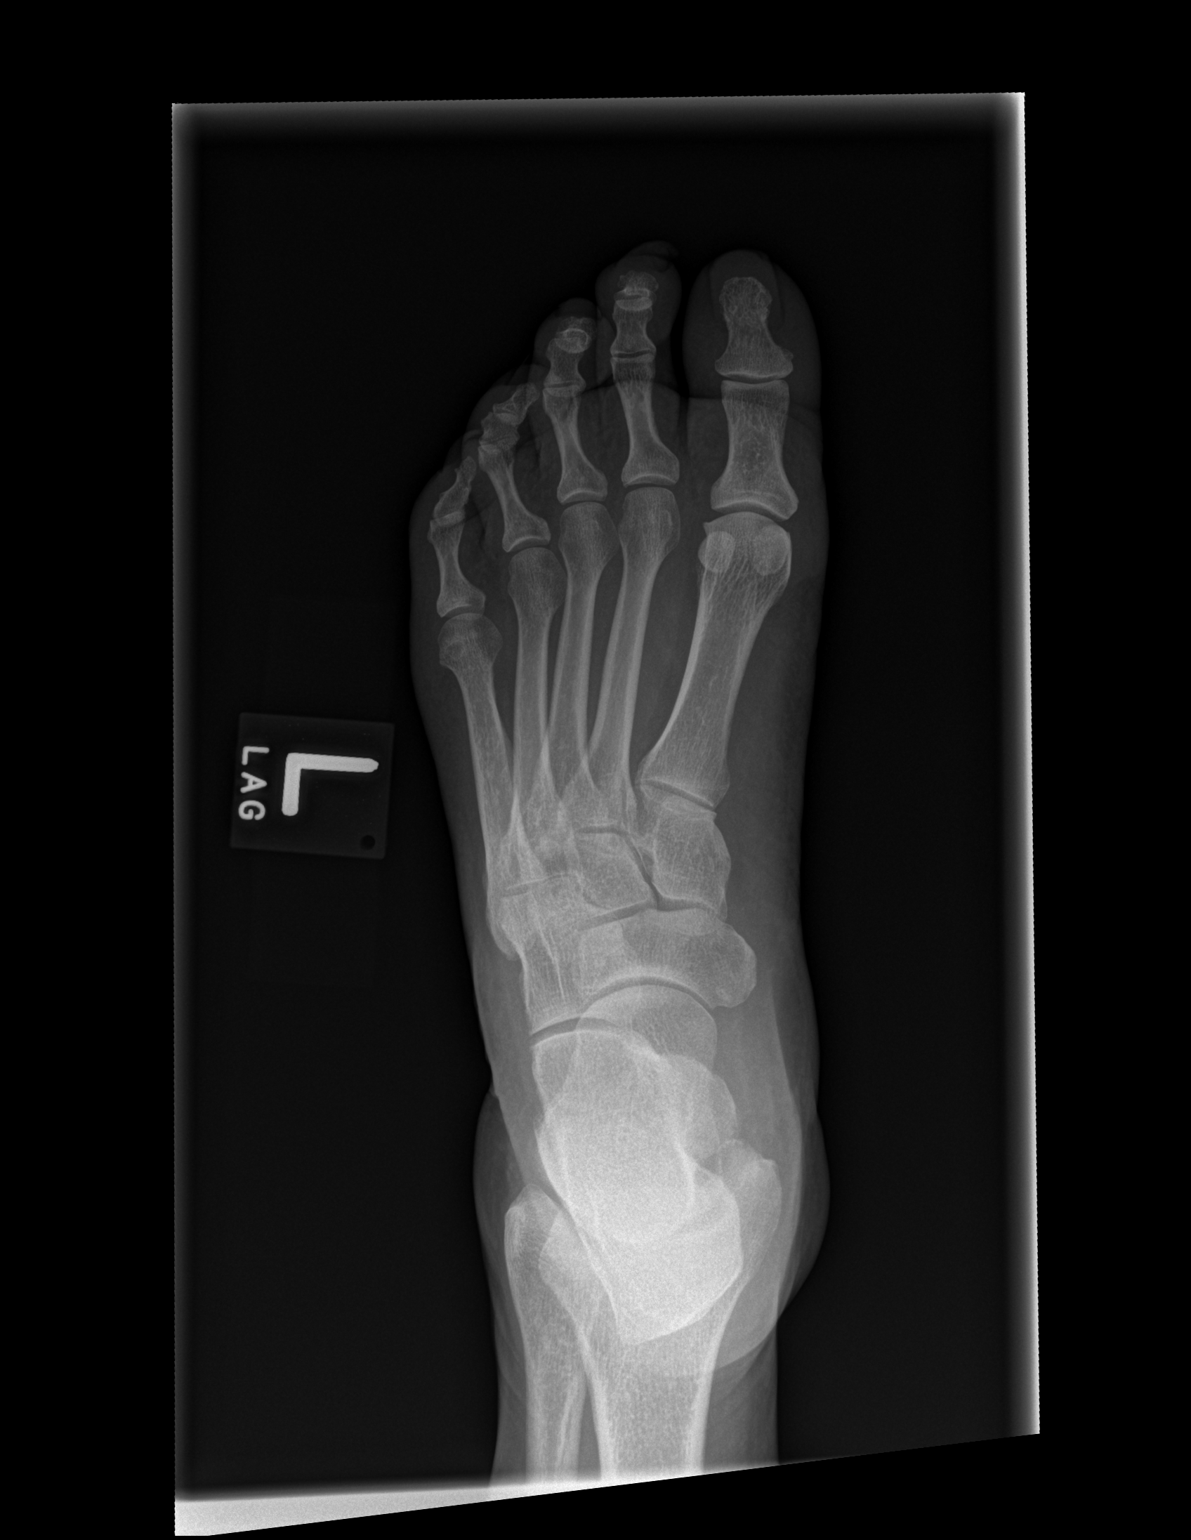

[x foot obl left]
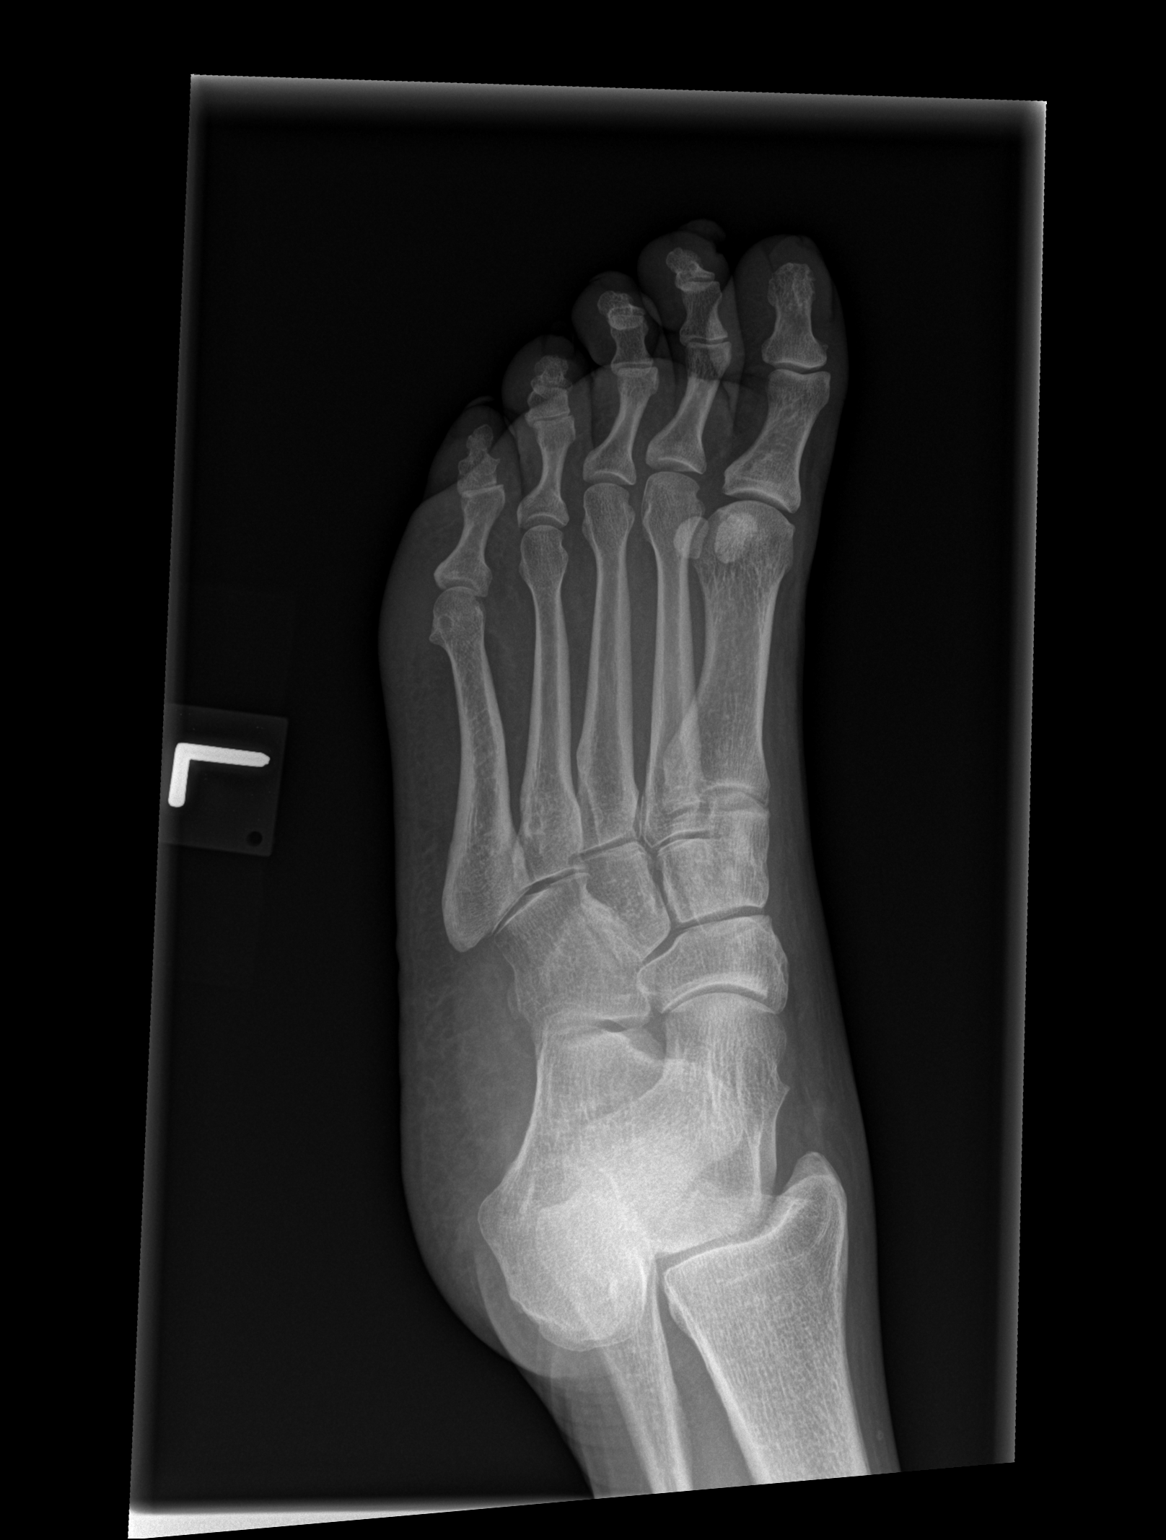

[x foot lat left]
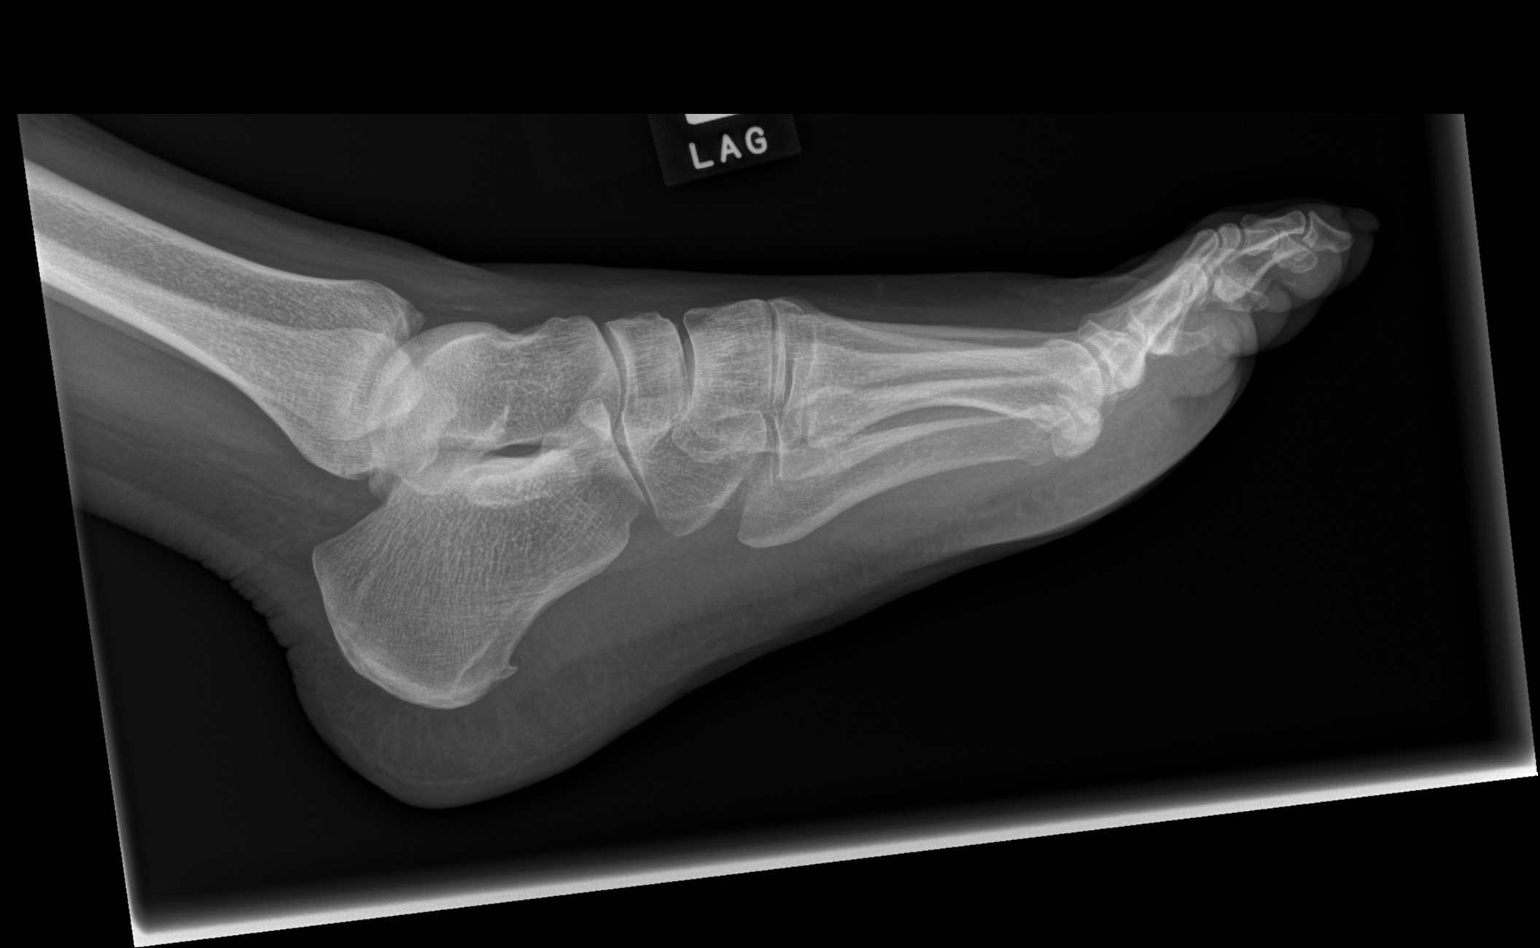

[3 of 3 positions shown; findings below may reference images not displayed]

FINDINGS: There is no evidence of fracture or dislocation. There is no
evidence of arthropathy or other focal bone abnormality. Soft
tissues are unremarkable. Small plantar calcaneal spur.
IMPRESSION: No acute osseous abnormality.  Small plantar calcaneal spur

## 2022-04-25 ENCOUNTER — Other Ambulatory Visit: Payer: Self-pay | Admitting: Orthopedic Surgery

## 2022-04-25 DIAGNOSIS — Z794 Long term (current) use of insulin: Secondary | ICD-10-CM

## 2022-05-06 ENCOUNTER — Other Ambulatory Visit: Payer: Medicare Other

## 2022-05-09 ENCOUNTER — Ambulatory Visit: Payer: Medicare Other | Admitting: Orthopedic Surgery

## 2022-05-13 ENCOUNTER — Other Ambulatory Visit: Payer: Medicare Other

## 2022-05-20 ENCOUNTER — Other Ambulatory Visit: Payer: 59

## 2022-05-20 DIAGNOSIS — E1165 Type 2 diabetes mellitus with hyperglycemia: Secondary | ICD-10-CM

## 2022-05-23 ENCOUNTER — Ambulatory Visit: Payer: 59 | Admitting: Nurse Practitioner

## 2022-05-23 ENCOUNTER — Ambulatory Visit: Payer: Medicare Other | Admitting: Orthopedic Surgery

## 2022-06-07 ENCOUNTER — Telehealth: Payer: Self-pay

## 2022-06-07 NOTE — Telephone Encounter (Signed)
I called patient to close healthcare gap. Voicemail left for patient with office number. Please ask patient if she had diabetic eye exam. Request papers from Dr office to be faxed over.

## 2022-07-09 ENCOUNTER — Ambulatory Visit (INDEPENDENT_AMBULATORY_CARE_PROVIDER_SITE_OTHER): Payer: 59 | Admitting: Podiatrist

## 2022-07-09 DIAGNOSIS — Z91199 Patient's noncompliance with other medical treatment and regimen due to unspecified reason: Secondary | ICD-10-CM

## 2022-07-15 NOTE — Progress Notes (Signed)
No show

## 2022-08-15 ENCOUNTER — Ambulatory Visit (INDEPENDENT_AMBULATORY_CARE_PROVIDER_SITE_OTHER): Payer: 59 | Admitting: Orthopedic Surgery

## 2022-08-15 ENCOUNTER — Encounter: Payer: Self-pay | Admitting: Orthopedic Surgery

## 2022-08-15 VITALS — BP 100/70 | HR 78 | Temp 97.0°F | Resp 16 | Ht 65.5 in | Wt 175.2 lb

## 2022-08-15 DIAGNOSIS — Z1231 Encounter for screening mammogram for malignant neoplasm of breast: Secondary | ICD-10-CM

## 2022-08-15 DIAGNOSIS — E785 Hyperlipidemia, unspecified: Secondary | ICD-10-CM

## 2022-08-15 DIAGNOSIS — E1169 Type 2 diabetes mellitus with other specified complication: Secondary | ICD-10-CM | POA: Diagnosis not present

## 2022-08-15 DIAGNOSIS — E1165 Type 2 diabetes mellitus with hyperglycemia: Secondary | ICD-10-CM

## 2022-08-15 DIAGNOSIS — Z6828 Body mass index (BMI) 28.0-28.9, adult: Secondary | ICD-10-CM

## 2022-08-15 DIAGNOSIS — Z87891 Personal history of nicotine dependence: Secondary | ICD-10-CM | POA: Diagnosis not present

## 2022-08-15 DIAGNOSIS — Z794 Long term (current) use of insulin: Secondary | ICD-10-CM

## 2022-08-15 MED ORDER — LISINOPRIL 5 MG PO TABS
ORAL_TABLET | ORAL | 1 refills | Status: DC
Start: 2022-08-15 — End: 2023-04-25

## 2022-08-15 MED ORDER — ROSUVASTATIN CALCIUM 5 MG PO TABS
5.0000 mg | ORAL_TABLET | Freq: Every day | ORAL | 3 refills | Status: AC
Start: 2022-08-15 — End: ?

## 2022-08-15 NOTE — Patient Instructions (Addendum)
Will call tomorrow with lab results  Please get mammogram at [facility]> [phone]  CT chest to rule out lung cancer> ordered with [facility]> they should call to schedule> if you do not hear from them in 2 week> please call them to schedule  Needs fasting labs (cannot eat or drink prior to having labs drawn> ok to drink water) next time> schedule lab visit to have cholesterol checked

## 2022-08-15 NOTE — Progress Notes (Signed)
Careteam: Patient Care Team: Octavia Heir, NP as PCP - General (Adult Health Nurse Practitioner)  Seen by: Hazle Nordmann, AGNP-C  PLACE OF SERVICE:  [facility]  Advanced Directive information Does Patient Have a Medical Advance Directive?: No, Would patient like information on creating a medical advance directive?: No - Patient declined  Allergies  Allergen Reactions   Codeine Rash   Doxycycline Rash    Rash on legs   Penicillins Rash    Has patient had a PCN reaction causing immediate rash, facial/tongue/throat swelling, SOB or lightheadedness with hypotension: Yes Has patient had a PCN reaction causing severe rash involving mucus membranes or skin necrosis: No Has patient had a PCN reaction that required hospitalization: No Has patient had a PCN reaction occurring within the last 10 years: No If all of the above answers are "NO", then may proceed with Cephalosporin use.     Chief Complaint  Patient presents with   Medical Management of Chronic Issues    6 month follow up.    Health Maintenance    Discuss the need for AWV, Eye exam, Lung cancer screening, Foot exam, and Hemoglobin A1C.   Immunizations    Discuss the need for Pne vaccine, and Shingrix vaccine.     HPI: Patient is a 72 y.o. female seen today for medical management of chronic conditions.   No health concerns today.   She has been taking metformin daily. She admits to following low carb/sugar diet. No known hypoglycemias, but she does not check sugars regularly.   BP controlled with lisinopril.   Remains on pravastatin for cholesterol.   Weight up 9 lbs.   Reports recent death in family.   Discussed yearly mammogram and CT chest r/o lung cancer.    Review of Systems:  Review of Systems  Constitutional:  Negative for chills and fever.  HENT:  Negative for congestion.   Eyes:  Negative for blurred vision.  Respiratory:  Negative for cough, shortness of breath and wheezing.   Cardiovascular:   Negative for chest pain and leg swelling.  Gastrointestinal:  Negative for abdominal pain, constipation and heartburn.  Genitourinary:  Negative for dysuria.  Musculoskeletal:  Negative for falls and joint pain.  Skin:  Negative for rash.  Neurological:  Negative for dizziness, weakness and headaches.  Psychiatric/Behavioral:  Negative for depression and memory loss. The patient is not nervous/anxious and does not have insomnia.     Past Medical History:  Diagnosis Date   Arthritis    Cataract 2022   bilateral sx   Depression    Diabetes mellitus without complication (HCC)    on meds   Hyperlipidemia    on meds   Hypertension    on meds   Past Surgical History:  Procedure Laterality Date   HAND SURGERY Left 1972   TOTAL KNEE ARTHROPLASTY Left 2012   TOTAL KNEE ARTHROPLASTY Right 2017   Social History:   reports that she quit smoking about 3 years ago. Her smoking use included cigarettes. She has a 30.00 pack-year smoking history. She has never used smokeless tobacco. She reports that she does not drink alcohol and does not use drugs.  Family History  Problem Relation Age of Onset   Cancer Mother    Prostate cancer Father    Cancer Father    Colon polyps Neg Hx    Colon cancer Neg Hx    Esophageal cancer Neg Hx    Rectal cancer Neg Hx  Stomach cancer Neg Hx     Medications: Patient's Medications  New Prescriptions   No medications on file  Previous Medications   ACETAMINOPHEN (TYLENOL EXTRA STRENGTH PO)    Take 1 tablet by mouth as needed.   ASPIRIN EC 81 MG TABLET    Take 1 tablet (81 mg total) by mouth daily. Swallow whole.   BLOOD GLUCOSE METER KIT AND SUPPLIES    Dispense based on patient and insurance preference. Use up to four times daily as directed. (FOR ICD-10 E10.9, E11.9).   GLUCOSE BLOOD TEST STRIP    1 each by Other route as needed for other. Use as instructed   LANCETS (ACCU-CHEK MULTICLIX) LANCETS    1 each by Other route as needed for other. Use as  instructed   LISINOPRIL (ZESTRIL) 5 MG TABLET    TAKE 1 TABLET(5 MG) BY MOUTH DAILY   METFORMIN (GLUCOPHAGE) 500 MG TABLET    Take 1 tablet (500 mg total) by mouth every morning.   ROSUVASTATIN (CRESTOR) 5 MG TABLET    Take 1 tablet (5 mg total) by mouth daily.  Modified Medications   No medications on file  Discontinued Medications   B-D UF III MINI PEN NEEDLES 31G X 5 MM MISC    Inject 1 Device into the skin 4 (four) times daily.    Physical Exam:  Vitals:   08/15/22 1306  BP: 100/70  Pulse: 78  Resp: 16  Temp: (!) 97 F (36.1 C)  SpO2: 98%  Weight: 175 lb 3.2 oz (79.5 kg)  Height: 5' 5.5" (1.664 m)   Body mass index is 28.71 kg/m. Wt Readings from Last 3 Encounters:  08/15/22 175 lb 3.2 oz (79.5 kg)  01/10/22 164 lb 12.8 oz (74.8 kg)  08/30/21 160 lb 4.8 oz (72.7 kg)    Physical Exam Vitals reviewed.  Constitutional:      General: She is not in acute distress. HENT:     Head: Normocephalic.  Eyes:     General:        Right eye: No discharge.        Left eye: No discharge.  Cardiovascular:     Rate and Rhythm: Normal rate and regular rhythm.     Pulses:          Dorsalis pedis pulses are 1+ on the right side and 1+ on the left side.       Posterior tibial pulses are 1+ on the right side and 1+ on the left side.     Heart sounds: Normal heart sounds.  Pulmonary:     Effort: Pulmonary effort is normal.     Breath sounds: Normal breath sounds.  Abdominal:     General: Bowel sounds are normal. There is no distension.     Palpations: Abdomen is soft.     Tenderness: There is no abdominal tenderness.  Musculoskeletal:     Cervical back: Neck supple.     Right lower leg: No edema.     Left lower leg: No edema.  Feet:     Right foot:     Protective Sensation: 10 sites tested.  10 sites sensed.     Skin integrity: Skin integrity normal.     Toenail Condition: Right toenails are normal.     Left foot:     Protective Sensation: 10 sites tested.  10 sites sensed.      Skin integrity: Skin integrity normal.     Toenail Condition: Left toenails are normal.  Skin:    General: Skin is warm and dry.     Capillary Refill: Capillary refill takes less than 2 seconds.  Neurological:     General: No focal deficit present.     Mental Status: She is alert and oriented to person, place, and time.  Psychiatric:        Mood and Affect: Mood normal.        Behavior: Behavior normal.     Labs reviewed: Basic Metabolic Panel: Recent Labs    08/29/21 1717 01/10/22 1101  NA 141 137  K 3.8 4.3  CL 108 104  CO2 26 27  GLUCOSE 86 122  BUN 11 10  CREATININE 0.50 0.66  CALCIUM 9.5 9.6  TSH  --  0.92   Liver Function Tests: Recent Labs    01/10/22 1101  AST 11  ALT 10  BILITOT 0.3  PROT 6.7   No results for input(s): "LIPASE", "AMYLASE" in the last 8760 hours. No results for input(s): "AMMONIA" in the last 8760 hours. CBC: Recent Labs    08/29/21 1717  WBC 9.6  NEUTROABS 6.0  HGB 14.9  HCT 44.8  MCV 92.2  PLT 220   Lipid Panel: No results for input(s): "CHOL", "HDL", "LDLCALC", "TRIG", "CHOLHDL", "LDLDIRECT" in the last 8760 hours. TSH: Recent Labs    01/10/22 1101  TSH 0.92   A1C: Lab Results  Component Value Date   HGBA1C 6.7 (H) 01/10/2022     Assessment/Plan 1. Type 2 diabetes mellitus with hyperglycemia, with long-term current use of insulin (HCC) - A1c 5.8 08/2021 - no known hypoglycemias, does not check sugars - cont asa, statin, ACE - will recheck A1c before refilling medication - cont diet low in carbs and sugars - advised to schedule eye exam - CBC with Differential/Platelet - Complete Metabolic Panel with eGFR - Hemoglobin A1c - lisinopril (ZESTRIL) 5 MG tablet; TAKE 1 TABLET(5 MG) BY MOUTH DAILY  Dispense: 90 tablet; Refill: 1 - rosuvastatin (CRESTOR) 5 MG tablet; Take 1 tablet (5 mg total) by mouth daily.  Dispense: 90 tablet; Refill: 3  2. Hyperlipidemia associated with type 2 diabetes mellitus (HCC) - LDL  109 03/2021, goal < 70 - lipid panel- future - rosuvastatin (CRESTOR) 5 MG tablet; Take 1 tablet (5 mg total) by mouth daily.  Dispense: 90 tablet; Refill: 3  3. Encounter for screening mammogram for malignant neoplasm of breast - MM Digital Screening; Future  4. History of smoking 25-50 pack years - quit smoking at age 66 or 57- cannot recall - CT CHEST LUNG CA SCREEN LOW DOSE W/O CM; Future  5. BMI 28.0-28.9,adult - weight up 9 lbs - discussed exercise 150 min/week - discussed limiting calories to < 1600/day  Total time: 32 minutes. Greater than 50% of total time spent doing patient education regarding health maintenance, T2DM, HTN, weight loss including symptom/medication management.    Next appt: Visit date not found  Nayomi Tabron Scherry Ran  [facility] [phone]

## 2022-08-16 ENCOUNTER — Other Ambulatory Visit: Payer: Self-pay | Admitting: Orthopedic Surgery

## 2022-08-16 DIAGNOSIS — Z794 Long term (current) use of insulin: Secondary | ICD-10-CM

## 2022-08-16 DIAGNOSIS — E1165 Type 2 diabetes mellitus with hyperglycemia: Secondary | ICD-10-CM

## 2022-08-16 LAB — COMPLETE METABOLIC PANEL WITH GFR
AG Ratio: 1.4 (calc) (ref 1.0–2.5)
ALT: 8 U/L (ref 6–29)
AST: 9 U/L — ABNORMAL LOW (ref 10–35)
Albumin: 3.9 g/dL (ref 3.6–5.1)
Alkaline phosphatase (APISO): 103 U/L (ref 37–153)
BUN: 11 mg/dL (ref 7–25)
CO2: 28 mmol/L (ref 20–32)
Calcium: 9.7 mg/dL (ref 8.6–10.4)
Chloride: 103 mmol/L (ref 98–110)
Creat: 0.65 mg/dL (ref 0.50–1.05)
Globulin: 2.8 g/dL (calc) (ref 1.9–3.7)
Glucose, Bld: 129 mg/dL — ABNORMAL HIGH (ref 65–99)
Potassium: 4 mmol/L (ref 3.5–5.3)
Sodium: 138 mmol/L (ref 135–146)
Total Bilirubin: 0.2 mg/dL (ref 0.2–1.2)
Total Protein: 6.7 g/dL (ref 6.1–8.1)
eGFR: 95 mL/min/{1.73_m2} (ref >=60)

## 2022-08-16 LAB — CBC WITH DIFFERENTIAL/PLATELET
Absolute Monocytes: 697 cells/uL (ref 200–950)
Basophils Absolute: 60 cells/uL (ref 0–200)
Basophils Relative: 0.7 %
Eosinophils Absolute: 284 cells/uL (ref 15–500)
Eosinophils Relative: 3.3 %
HCT: 43.4 % (ref 35.0–45.0)
Hemoglobin: 14.4 g/dL (ref 11.7–15.5)
Lymphs Abs: 3165 cells/uL (ref 850–3900)
MCH: 29.8 pg (ref 27.0–33.0)
MCHC: 33.2 g/dL (ref 32.0–36.0)
MCV: 89.7 fL (ref 80.0–100.0)
MPV: 9.6 fL (ref 7.5–12.5)
Monocytes Relative: 8.1 %
Neutro Abs: 4395 cells/uL (ref 1500–7800)
Neutrophils Relative %: 51.1 %
Platelets: 253 10*3/uL (ref 140–400)
RBC: 4.84 10*6/uL (ref 3.80–5.10)
RDW: 13.3 % (ref 11.0–15.0)
Total Lymphocyte: 36.8 %
WBC: 8.6 10*3/uL (ref 3.8–10.8)

## 2022-08-16 LAB — HEMOGLOBIN A1C
Hgb A1c MFr Bld: 7.4 % of total Hgb — ABNORMAL HIGH (ref <5.7)
Mean Plasma Glucose: 166 mg/dL
eAG (mmol/L): 9.2 mmol/L

## 2022-08-16 MED ORDER — METFORMIN HCL 500 MG PO TABS
500.0000 mg | ORAL_TABLET | Freq: Two times a day (BID) | ORAL | 3 refills | Status: AC
Start: 2022-08-16 — End: ?

## 2022-08-19 ENCOUNTER — Other Ambulatory Visit: Payer: Self-pay

## 2022-08-19 DIAGNOSIS — Z794 Long term (current) use of insulin: Secondary | ICD-10-CM

## 2022-08-22 ENCOUNTER — Other Ambulatory Visit: Payer: 59

## 2022-08-22 ENCOUNTER — Other Ambulatory Visit: Payer: Self-pay

## 2022-08-22 DIAGNOSIS — E1169 Type 2 diabetes mellitus with other specified complication: Secondary | ICD-10-CM | POA: Diagnosis not present

## 2022-08-22 DIAGNOSIS — E1165 Type 2 diabetes mellitus with hyperglycemia: Secondary | ICD-10-CM | POA: Diagnosis not present

## 2022-08-22 DIAGNOSIS — E785 Hyperlipidemia, unspecified: Secondary | ICD-10-CM | POA: Diagnosis not present

## 2022-08-22 DIAGNOSIS — Z794 Long term (current) use of insulin: Secondary | ICD-10-CM | POA: Diagnosis not present

## 2022-08-22 DIAGNOSIS — Z1231 Encounter for screening mammogram for malignant neoplasm of breast: Secondary | ICD-10-CM

## 2022-08-23 ENCOUNTER — Other Ambulatory Visit: Payer: Self-pay | Admitting: Orthopedic Surgery

## 2022-08-23 DIAGNOSIS — Z794 Long term (current) use of insulin: Secondary | ICD-10-CM

## 2022-08-23 LAB — HEMOGLOBIN A1C
Hgb A1c MFr Bld: 7.3 % of total Hgb — ABNORMAL HIGH (ref <5.7)
Mean Plasma Glucose: 163 mg/dL
eAG (mmol/L): 9 mmol/L

## 2022-08-23 LAB — LIPID PANEL
Cholesterol: 169 mg/dL (ref <200)
HDL: 62 mg/dL (ref >=50)
LDL Cholesterol (Calc): 88 mg/dL (calc)
Non-HDL Cholesterol (Calc): 107 mg/dL (calc) (ref <130)
Total CHOL/HDL Ratio: 2.7 (calc) (ref <5.0)
Triglycerides: 94 mg/dL (ref <150)

## 2022-08-27 ENCOUNTER — Telehealth: Payer: Self-pay

## 2022-08-27 DIAGNOSIS — E1165 Type 2 diabetes mellitus with hyperglycemia: Secondary | ICD-10-CM

## 2022-08-27 NOTE — Telephone Encounter (Signed)
Outgoing call placed to patient, patient states she was planning on contacting her insurance company to see who she can see for an eye exam. Patient aware that we can place a referral and she agreed to that instead.

## 2022-08-29 ENCOUNTER — Ambulatory Visit: Payer: Medicare Other

## 2022-09-20 ENCOUNTER — Ambulatory Visit
Admission: RE | Admit: 2022-09-20 | Discharge: 2022-09-20 | Disposition: A | Discharge status: Home / Self Care | Payer: 59 | Source: Ambulatory Visit | Attending: Orthopedic Surgery | Admitting: Orthopedic Surgery

## 2022-09-20 DIAGNOSIS — Z1231 Encounter for screening mammogram for malignant neoplasm of breast: Secondary | ICD-10-CM

## 2022-09-25 ENCOUNTER — Inpatient Hospital Stay: Admission: RE | Admit: 2022-09-25 | Payer: 59 | Source: Ambulatory Visit

## 2022-10-03 ENCOUNTER — Telehealth: Payer: 59

## 2022-10-03 NOTE — Telephone Encounter (Signed)
Patient called requesting a rx for pain medication due to having should pain. She was advised that appointment is required before medication can be prescribed and patient declined appointment.  Just a [place]

## 2022-10-23 ENCOUNTER — Other Ambulatory Visit: Payer: Self-pay

## 2022-10-23 ENCOUNTER — Encounter (HOSPITAL_COMMUNITY): Payer: Self-pay

## 2022-10-23 ENCOUNTER — Emergency Department (HOSPITAL_COMMUNITY)
Admission: EM | Admit: 2022-10-23 | Discharge: 2022-10-23 | Disposition: A | Discharge status: Home / Self Care | Payer: 59 | Attending: Emergency Medicine | Admitting: Emergency Medicine

## 2022-10-23 DIAGNOSIS — Z7982 Long term (current) use of aspirin: Secondary | ICD-10-CM | POA: Diagnosis not present

## 2022-10-23 DIAGNOSIS — M62838 Other muscle spasm: Secondary | ICD-10-CM | POA: Diagnosis not present

## 2022-10-23 DIAGNOSIS — I10 Essential (primary) hypertension: Secondary | ICD-10-CM | POA: Diagnosis not present

## 2022-10-23 DIAGNOSIS — M542 Cervicalgia: Secondary | ICD-10-CM | POA: Insufficient documentation

## 2022-10-23 DIAGNOSIS — Z79899 Other long term (current) drug therapy: Secondary | ICD-10-CM | POA: Diagnosis not present

## 2022-10-23 DIAGNOSIS — E119 Type 2 diabetes mellitus without complications: Secondary | ICD-10-CM | POA: Diagnosis not present

## 2022-10-23 DIAGNOSIS — Z743 Need for continuous supervision: Secondary | ICD-10-CM | POA: Diagnosis not present

## 2022-10-23 DIAGNOSIS — Z7984 Long term (current) use of oral hypoglycemic drugs: Secondary | ICD-10-CM | POA: Diagnosis not present

## 2022-10-23 MED ORDER — CYCLOBENZAPRINE HCL 5 MG PO TABS: 5.0000 mg | ORAL_TABLET | Freq: Every day | ORAL | 0 refills | Status: AC

## 2022-10-23 MED ORDER — CYCLOBENZAPRINE HCL 10 MG PO TABS
5.0000 mg | ORAL_TABLET | Freq: Once | ORAL | Status: AC
  Administered 2022-10-23: 5 mg via ORAL
  Filled 2022-10-23: qty 1

## 2022-10-23 NOTE — ED Triage Notes (Signed)
Patient BIB [facility] from home. Woke up with left sided neck pain that moves down into her shoulder. No falls. Tried heat and tylenol without relief.

## 2022-10-23 NOTE — Discharge Instructions (Addendum)
Thank you for allowing me to be a part of your care today.  You were evaluated in the ED for neck pain.   I suspect your symptoms are related to muscle spasm.  I am sending you home on a short course of muscle relaxants to help with your symptoms.  I recommend continuing to use ice, heat, Tylenol and over-the-counter lidocaine patches to help alleviate your symptoms.    Do not drive, operate heavy machinery, or perform tasks that require you to be completely awake while taking muscle relaxant.  Do not consume alcohol while taking this medication.   Please follow up with your primary care provider if your symptoms persist.  Return to the ED if you develop sudden worsening of your symptoms or if you have any new concerns.

## 2022-10-23 NOTE — ED Provider Notes (Signed)
[place] EMERGENCY DEPARTMENT AT [facility] Provider Note   CSN: [id number] Arrival date & time: 10/23/22  1633     History {Add pertinent medical, surgical, social history, OB history to HPI:1} Chief Complaint  Patient presents with   Neck Pain    [name] is a 72 y.o. female with past medical history significant for hypertension, hyperlipidemia, diabetes presents to the ED via EMS from home complaining of left sided neck pain that radiates into her head and left shoulder.  Patient tried Tylenol, heat, and topical anti-inflammatories without much relief of symptoms.  She states she has had muscle spasms in the past and believes she "slept on it wrong".  Denies weakness or numbness in upper extremities, headache, fall or other injury.        Home Medications Prior to Admission medications   Medication Sig Start Date End Date Taking? Authorizing Provider  Acetaminophen (TYLENOL EXTRA STRENGTH PO) Take 1 tablet by mouth as needed.    [provider]  aspirin EC 81 MG tablet Take 1 tablet (81 mg total) by mouth daily. Swallow whole. 08/30/21   Octavia Heir, NP  blood glucose meter kit and supplies Dispense based on patient and insurance preference. Use up to four times daily as directed. (FOR ICD-10 E10.9, E11.9). 03/29/21   Fargo, Amy E, NP  glucose blood test strip 1 each by Other route as needed for other. Use as instructed 01/10/22   Octavia Heir, NP  Lancets (ACCU-CHEK MULTICLIX) lancets 1 each by Other route as needed for other. Use as instructed 04/12/21   Fargo, Amy E, NP  lisinopril (ZESTRIL) 5 MG tablet TAKE 1 TABLET(5 MG) BY MOUTH DAILY 08/15/22   Fargo, Amy E, NP  metFORMIN (GLUCOPHAGE) 500 MG tablet Take 1 tablet (500 mg total) by mouth 2 (two) times daily with a meal. 08/16/22   Fargo, Amy E, NP  rosuvastatin (CRESTOR) 5 MG tablet Take 1 tablet (5 mg total) by mouth daily. 08/15/22   Octavia Heir, NP      Allergies    Codeine, Doxycycline, and  Penicillins    Review of Systems   Review of Systems  Musculoskeletal:  Positive for neck pain.    Physical Exam Updated Vital Signs BP 121/83 (BP Location: Left Arm)   Pulse 70   Temp 98.6 F (37 C) (Oral)   Resp 17   Ht 5' 5.5" (1.664 m)   Wt 70.3 kg   SpO2 100%   BMI 25.40 kg/m  Physical Exam  ED Results / Procedures / Treatments   Labs (all labs ordered are listed, but only abnormal results are displayed) Labs Reviewed - No data to display  EKG None  Radiology No results found.  Procedures Procedures  {Document cardiac monitor, telemetry assessment procedure when appropriate:1}  Medications Ordered in ED Medications  cyclobenzaprine (FLEXERIL) tablet 5 mg (5 mg Oral Given 10/23/22 1802)    ED Course/ Medical Decision Making/ A&P   {   Click here for ABCD2, HEART and other calculatorsREFRESH Note before signing :1}                          Medical Decision Making Risk Prescription drug management.   ***  {Document critical care time when appropriate:1} {Document review of labs and clinical decision tools ie heart score, Chads2Vasc2 etc:1}  {Document your independent review of radiology images, and any outside records:1} {Document your discussion with family members,  caretakers, and with consultants:1} {Document social determinants of health affecting pt's care:1} {Document your decision making why or why not admission, treatments were needed:1} Final Clinical Impression(s) / ED Diagnoses Final diagnoses:  None    Rx / DC Orders ED Discharge Orders     None

## 2022-11-19 ENCOUNTER — Other Ambulatory Visit: Payer: 59

## 2022-11-22 ENCOUNTER — Encounter: Payer: Self-pay | Admitting: Family Medicine

## 2022-11-25 ENCOUNTER — Encounter: Payer: Self-pay | Admitting: Family Medicine

## 2022-12-30 ENCOUNTER — Encounter: Payer: 59 | Admitting: Orthopedic Surgery

## 2023-01-02 ENCOUNTER — Ambulatory Visit: Payer: 59 | Admitting: Orthopedic Surgery

## 2023-04-25 ENCOUNTER — Other Ambulatory Visit: Payer: Self-pay | Admitting: Orthopedic Surgery

## 2023-04-25 DIAGNOSIS — E1165 Type 2 diabetes mellitus with hyperglycemia: Secondary | ICD-10-CM

## 2023-07-30 ENCOUNTER — Emergency Department (HOSPITAL_COMMUNITY)

## 2023-07-30 ENCOUNTER — Encounter (HOSPITAL_COMMUNITY): Payer: Self-pay

## 2023-07-30 ENCOUNTER — Emergency Department (HOSPITAL_COMMUNITY): Admission: EM | Admit: 2023-07-30 | Discharge: 2023-07-30 | Disposition: A | Discharge status: Home / Self Care

## 2023-07-30 ENCOUNTER — Other Ambulatory Visit: Payer: Self-pay

## 2023-07-30 DIAGNOSIS — Z87891 Personal history of nicotine dependence: Secondary | ICD-10-CM | POA: Diagnosis not present

## 2023-07-30 DIAGNOSIS — E119 Type 2 diabetes mellitus without complications: Secondary | ICD-10-CM | POA: Diagnosis not present

## 2023-07-30 DIAGNOSIS — R051 Acute cough: Secondary | ICD-10-CM | POA: Diagnosis not present

## 2023-07-30 DIAGNOSIS — Z79899 Other long term (current) drug therapy: Secondary | ICD-10-CM | POA: Insufficient documentation

## 2023-07-30 DIAGNOSIS — Z7984 Long term (current) use of oral hypoglycemic drugs: Secondary | ICD-10-CM | POA: Diagnosis not present

## 2023-07-30 DIAGNOSIS — M79645 Pain in left finger(s): Secondary | ICD-10-CM | POA: Diagnosis present

## 2023-07-30 DIAGNOSIS — Z7982 Long term (current) use of aspirin: Secondary | ICD-10-CM | POA: Insufficient documentation

## 2023-07-30 DIAGNOSIS — I10 Essential (primary) hypertension: Secondary | ICD-10-CM | POA: Insufficient documentation

## 2023-07-30 MED ORDER — ACETAMINOPHEN 325 MG PO TABS
650.0000 mg | ORAL_TABLET | Freq: Once | ORAL | Status: AC
  Administered 2023-07-30: 650 mg via ORAL
  Filled 2023-07-30: qty 2

## 2023-07-30 MED ORDER — IBUPROFEN 400 MG PO TABS: 400.0000 mg | ORAL_TABLET | Freq: Three times a day (TID) | ORAL | 0 refills | Status: AC | PRN

## 2023-07-30 MED ORDER — IBUPROFEN 200 MG PO TABS
600.0000 mg | ORAL_TABLET | Freq: Once | ORAL | Status: AC
  Administered 2023-07-30: 600 mg via ORAL
  Filled 2023-07-30: qty 3

## 2023-07-30 MED ORDER — HYDROCODONE-ACETAMINOPHEN 5-325 MG PO TABS: 1.0000 | ORAL_TABLET | Freq: Once | ORAL | Status: DC

## 2023-07-30 MED ORDER — DICLOFENAC SODIUM 1 % EX GEL: 2.0000 g | Freq: Four times a day (QID) | CUTANEOUS | 0 refills | Status: AC

## 2023-07-30 NOTE — Discharge Instructions (Signed)
 For cough -- cough drops, water tea with honey and lemon, try allergy medication over the counter for the next 2-4 weeks like Claritin, Zyrtec.  For finger - 1000mg  of Tylenol every 6 hours, Ibuprofen every 8 hours. You can try ice or heat. Apply Voltran gel. Follow up with PCP or Dr Abigail Abler (Hand specialist)

## 2023-07-30 NOTE — Progress Notes (Signed)
 Orthopedic Tech Progress Note Patient Details:  [name] [birth year] [id number]  Ortho Devices Type of Ortho Device: Finger splint Ortho Device/Splint Location: left #2 finger splint Ortho Device/Splint Interventions: Ordered, Application, Adjustment   Post Interventions Patient Tolerated: Well Instructions Provided: Adjustment of device, Care of device  Leodis Rainwater 07/30/2023, 11:46 AM

## 2023-07-30 NOTE — ED Provider Notes (Signed)
 [place] EMERGENCY DEPARTMENT AT [facility] Provider Note   CSN: [id number] Arrival date & time: 07/30/23  4332     History  Chief Complaint  Patient presents with   Hand Pain         [name] is a 73 y.o. female. With arthritis, HTN, HLD, DM2, tobacco use presenting with complaint of left index and middle finger swelling and pain. She reports that she has been doing a lot of crocheting and her hands have been painful. Reports she has had similar symptoms in the past, but seem to be worse than normal. She also notes history of arthritis. She says this started 2-3 days ago. No rash over area. Normal ROM, but pain is worse with ROM. Has never been told she has gout. No fever or chills. No recent trauma. No recent wound.  Also reports productive cough for 2 weeks associated with nasal drainage and congestion that she thinks is related to allergies. Denies CP or SOB. Reports some soreness with coughing. Again, no fevers or chills. Dose have significant smoking history, no longer smoking. No COPD or asthma.    Hand Pain       Home Medications Prior to Admission medications   Medication Sig Start Date End Date Taking? Authorizing Provider  Acetaminophen (TYLENOL EXTRA STRENGTH PO) Take 1 tablet by mouth as needed.    [provider]  aspirin EC 81 MG tablet Take 1 tablet (81 mg total) by mouth daily. Swallow whole. 08/30/21   Arnetha Bhat, NP  blood glucose meter kit and supplies Dispense based on patient and insurance preference. Use up to four times daily as directed. (FOR ICD-10 E10.9, E11.9). 03/29/21   Fargo, Amy E, NP  cyclobenzaprine (FLEXERIL) 5 MG tablet Take 1 tablet (5 mg total) by mouth at bedtime. 10/23/22   Clark, Meghan R, PA-C  glucose blood test strip 1 each by Other route as needed for other. Use as instructed 01/10/22   Arnetha Bhat, NP  Lancets (ACCU-CHEK MULTICLIX) lancets 1 each by Other route as needed for other. Use as instructed 04/12/21    Fargo, Amy E, NP  lisinopril (ZESTRIL) 5 MG tablet TAKE 1 TABLET(5 MG) BY MOUTH DAILY 04/25/23   Fargo, Amy E, NP  metFORMIN (GLUCOPHAGE) 500 MG tablet Take 1 tablet (500 mg total) by mouth 2 (two) times daily with a meal. 08/16/22   Fargo, Amy E, NP  rosuvastatin (CRESTOR) 5 MG tablet Take 1 tablet (5 mg total) by mouth daily. 08/15/22   Fargo, Amy E, NP      Allergies    Codeine, Doxycycline, and Penicillins    Review of Systems   Review of Systems  Respiratory:  Positive for cough.   Musculoskeletal:  Positive for arthralgias.    Physical Exam Updated Vital Signs BP 138/81   Pulse 63   Temp 98.8 F (37.1 C) (Oral)   Resp 16   SpO2 99%  Physical Exam Vitals and nursing note reviewed.  Constitutional:      General: She is not in acute distress.    Appearance: She is not toxic-appearing.  HENT:     Head: Normocephalic and atraumatic.  Eyes:     General: No scleral icterus.    Conjunctiva/sclera: Conjunctivae normal.  Cardiovascular:     Rate and Rhythm: Normal rate and regular rhythm.     Pulses: Normal pulses.     Heart sounds: Normal heart sounds.  Pulmonary:     Effort: Pulmonary  effort is normal. No respiratory distress.     Breath sounds: Normal breath sounds.     Comments: Lung CTAB, normal oxygenation.  Abdominal:     General: Abdomen is flat. Bowel sounds are normal.     Palpations: Abdomen is soft.     Tenderness: There is no abdominal tenderness.  Musculoskeletal:     Comments: Left PIP index finger swelling, pain with touch, no deformity. Strong radial pulse equal bilaterally. NO overlaying cellulitis or open skin. No purulent drainage.   Skin:    General: Skin is warm and dry.     Findings: No lesion.  Neurological:     General: No focal deficit present.     Mental Status: She is alert and oriented to person, place, and time. Mental status is at baseline.     ED Results / Procedures / Treatments   Labs (all labs ordered are listed, but only abnormal  results are displayed) Labs Reviewed - No data to display  EKG None  Radiology DG Chest 2 View Result Date: 07/30/2023 CLINICAL DATA:  Cough for 2 weeks. EXAM: CHEST - 2 VIEW COMPARISON:  Chest radiographs 08/29/2021 and 12/16/2015 FINDINGS: Cardiac silhouette and mediastinal contours are within limits. Moderate atherosclerotic calcifications within the aortic arch. The lungs are clear. No pleural effusion pneumothorax. Moderate multilevel degenerative disc changes of the thoracic spine. Mild dextrocurvature of the midthoracic spine, unchanged. Cholecystectomy clips. IMPRESSION: No active cardiopulmonary disease. Electronically Signed   By: Bertina Broccoli M.D.   On: 07/30/2023 09:57   DG Hand Complete Left Result Date: 07/30/2023 CLINICAL DATA:  Left index finger pain and swelling. EXAM: LEFT HAND - COMPLETE 3+ VIEW COMPARISON:  Left hand radiographs 03/11/2017 FINDINGS: There is again chronic fusion of the third PIP joint. Moderate fourth, mild-to-moderate second and third, and mild fifth DIP joint space narrowing and peripheral osteophytosis. Mild-to-moderate moderate second and fifth PIP joint space narrowing peripheral osteophytosis. Moderate dorsolateral thumb interphalangeal joint space narrowing, as, thus chondral sclerosis/cystic change, and peripheral osteophytosis. Mild thumb carpometacarpal joint space narrowing and peripheral osteophytosis. Mild degenerative spurring at the medial aspect of the index finger metacarpophalangeal joint without significant joint space narrowing. New mild soft tissue swelling of the proximal aspect of the index finger extending to the level of the PIP joint. New minimal 2 mm calcific density just medial/ulnar to the distal metaphysis of the proximal phalanx of the index finger. No acute fracture is seen. No dislocation. IMPRESSION: 1. New mild soft tissue swelling of the proximal aspect of the index finger extending to the level of the PIP joint. New minimal 2 mm  calcific density just medial/ulnar to the distal metaphysis of the proximal phalanx of the index finger. This is nonspecific and may represent an acute to subacute inflammatory process. No cortical erosion is seen in this region. 2. 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 3. 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 Electronically Signed   By: Bertina Broccoli M.D.   On: 07/30/2023 09:55    Procedures Procedures    Medications Ordered in ED Medications - No data to display  ED Course/ Medical Decision Making/ A&P                                 Medical Decision Making Amount and/or Complexity of Data Reviewed Radiology: ordered.  Risk OTC drugs.   This patient presents to the ED for concern of finger pain/  cough, this involves an extensive number of treatment options, and is a complaint that carries with it a high risk of complications and morbidity.  The differential diagnosis includes COPD, asthma, pna, acs, uri, allergic rhinitis, pleural effusion, mass, gout, septic joint, RA, osteoarthritis, sprain, fracture    Co morbidities that complicate the patient evaluation  Arthritis Smoking history    Additional history obtained:  Additional history obtained from 10/27/23 Ed visit, 03/11/17 left hand x-ray after similar complaint showing arthritis changes    Lab Tests:  Considered, however, do not feel it would change treatment plan.    Imaging Studies ordered:  I ordered imaging studies including chest, left hand  I independently visualized and interpreted imaging which showed negative chest x-ray, hand x-ray with OA changes of finger, no acute fracture or dislocation, no osteomyelitis  I agree with the radiologist interpretation   Cardiac Monitoring: / EKG:  The patient was maintained on a cardiac monitor.     Problem List / ED Course / Critical interventions / Medication management  Reporting to emergency room with complaint of finger pain.  Does  have some swelling over proximal interphalange joint which is tender to palpation.  She is neurovascularly intact and no fever no overlying cellulitis.  No recent trauma or injury to the skin or hand.  She has history of osteoarthritis in her hand and thinks she overdid it while crocheting.  Will obtain x-rays rule out acute fracture.  No obvious sign of infection or septic joint.  Will pain control and reassess Chest x-ray normal which was obtained due to cough.  There is no sign of pneumonia as she has lungs clear to auscultation bilaterally and she is not hypoxic thus I do not feel further workup is needed at this time.  Will start on allergy medicine to use for cough congestion azithromycin this is likely related to allergies.  She not tried anything yet.  She will also follow-up with primary care.  X-ray of hand shows diffuse mild arthritis changes.  No acute fracture or dislocation.  Pain is improved after Tylenol.  Will send home with symptom control and give follow-up with the hand surgeon. I ordered medication including Tylenol  Reevaluation of the patient after these medicines showed that the patient improved I have reviewed the patients home medicines and have made adjustments as needed   Plan  F/u w/ PCP in 2-3d to ensure resolution of sx.  Patient was given return precautions. Patient stable for discharge at this time.  Patient educated on sx/dx and verbalized understanding of plan. Return to ER w/ new or worsening sx.          Final Clinical Impression(s) / ED Diagnoses Final diagnoses:  Pain of finger of left hand  Acute cough    Rx / DC Orders ED Discharge Orders     None         Eudora Heron, PA-C 07/30/23 1052    Rolinda Climes, DO 07/30/23 1420

## 2024-03-23 IMAGING — CR DG CHEST 2V
2 series · 2 of 2 positions shown · non-contrast
Comparison: December 16, 2015.

CLINICAL DATA: Chest pain, productive cough.

EXAM:
CHEST - 2 VIEW

[w chest pa]
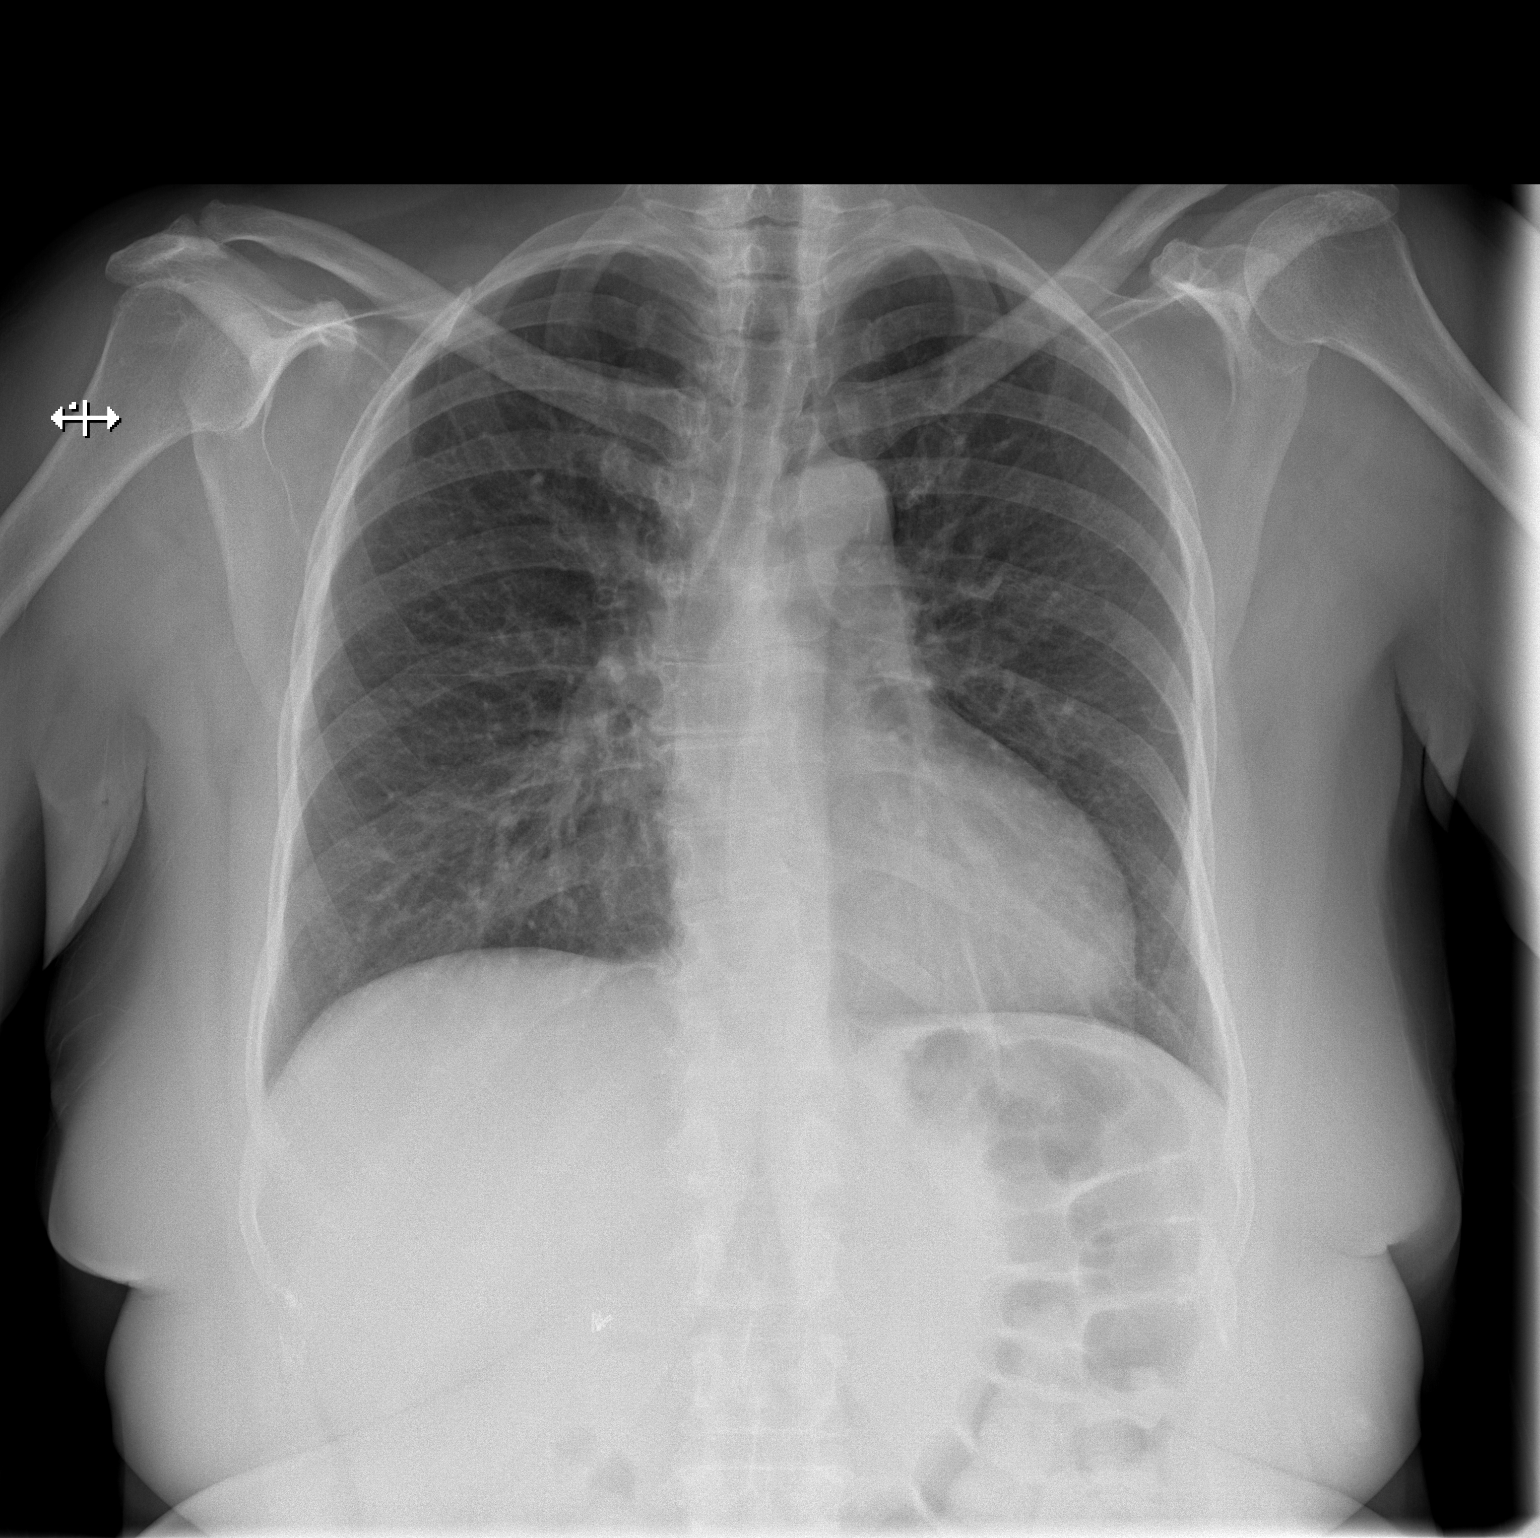

[w chest lat]
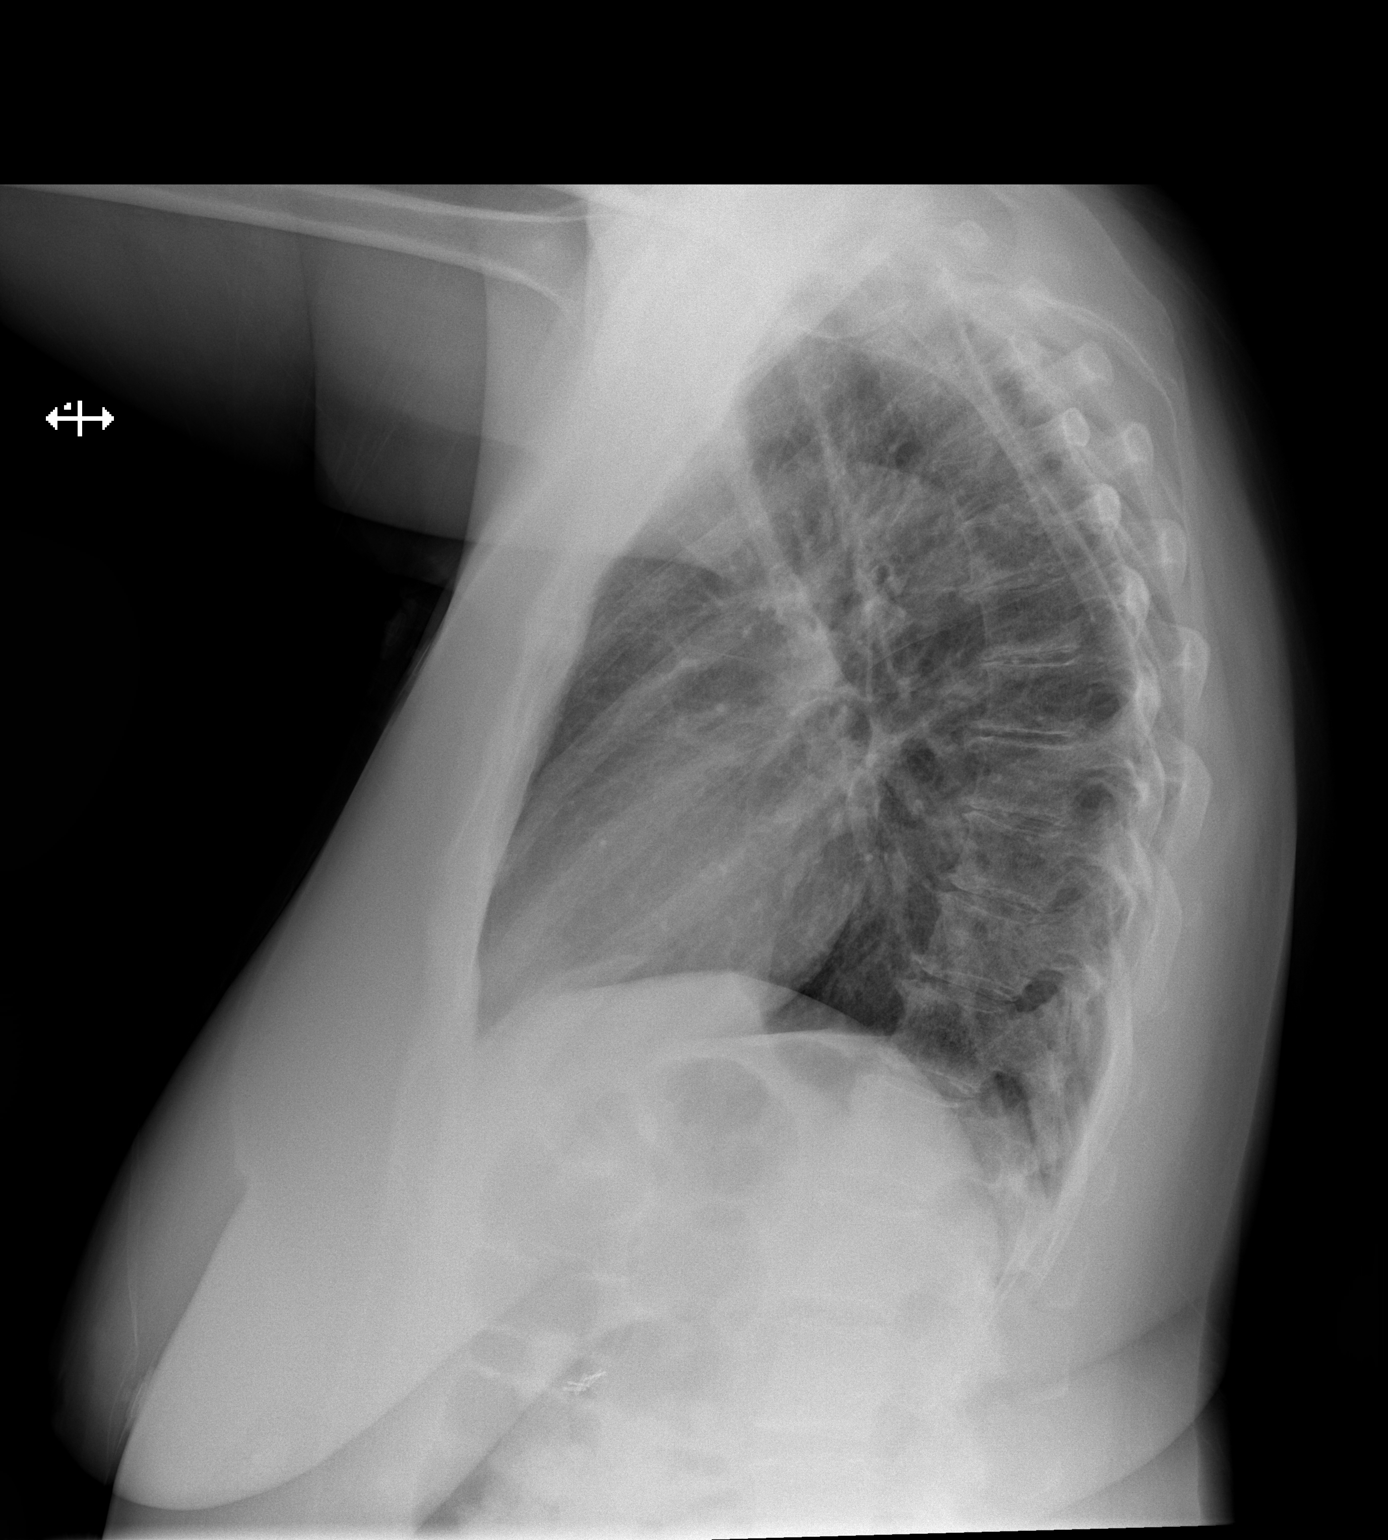

[2 of 2 positions shown; findings below may reference images not displayed]

FINDINGS: The heart size and mediastinal contours are within normal limits.
Both lungs are clear. The visualized skeletal structures are
unremarkable.
IMPRESSION: No active cardiopulmonary disease.
